# Patient Record
Sex: Female | Born: 1990 | Race: White | Hispanic: No | Marital: Married
Health system: Southern US, Community
[De-identification: ages and names within clinical notes are randomized; demographics above are authoritative.]

## PROBLEM LIST (undated history)

## (undated) DIAGNOSIS — D497 Neoplasm of unspecified behavior of endocrine glands and other parts of nervous system: Secondary | ICD-10-CM

## (undated) DIAGNOSIS — E039 Hypothyroidism, unspecified: Secondary | ICD-10-CM

## (undated) DIAGNOSIS — Z803 Family history of malignant neoplasm of breast: Secondary | ICD-10-CM

## (undated) DIAGNOSIS — L509 Urticaria, unspecified: Secondary | ICD-10-CM

## (undated) DIAGNOSIS — Z800 Family history of malignant neoplasm of digestive organs: Secondary | ICD-10-CM

## (undated) DIAGNOSIS — Z8481 Family history of carrier of genetic disease: Secondary | ICD-10-CM

## (undated) DIAGNOSIS — Z1589 Genetic susceptibility to other disease: Secondary | ICD-10-CM

## (undated) HISTORY — DX: Genetic susceptibility to other disease: Z15.89

## (undated) HISTORY — DX: Family history of carrier of genetic disease: Z84.81

## (undated) HISTORY — PX: CHOLECYSTECTOMY: SHX55

## (undated) HISTORY — DX: Urticaria, unspecified: L50.9

## (undated) HISTORY — DX: Family history of malignant neoplasm of breast: Z80.3

## (undated) HISTORY — DX: Family history of malignant neoplasm of digestive organs: Z80.0

---

## 2015-01-22 DIAGNOSIS — F988 Other specified behavioral and emotional disorders with onset usually occurring in childhood and adolescence: Secondary | ICD-10-CM | POA: Insufficient documentation

## 2015-12-02 DIAGNOSIS — R1011 Right upper quadrant pain: Secondary | ICD-10-CM | POA: Insufficient documentation

## 2015-12-02 DIAGNOSIS — K828 Other specified diseases of gallbladder: Secondary | ICD-10-CM | POA: Insufficient documentation

## 2016-05-29 ENCOUNTER — Encounter: Payer: Self-pay | Admitting: Pediatrics

## 2016-05-29 ENCOUNTER — Ambulatory Visit (INDEPENDENT_AMBULATORY_CARE_PROVIDER_SITE_OTHER): Payer: BC Managed Care – PPO | Admitting: Pediatrics

## 2016-05-29 VITALS — BP 100/72 | HR 76 | Temp 98.7°F | Resp 16 | Ht 62.0 in | Wt 146.4 lb

## 2016-05-29 DIAGNOSIS — L500 Allergic urticaria: Secondary | ICD-10-CM | POA: Insufficient documentation

## 2016-05-29 DIAGNOSIS — J3089 Other allergic rhinitis: Secondary | ICD-10-CM | POA: Diagnosis not present

## 2016-05-29 MED ORDER — RANITIDINE HCL 150 MG PO TABS: ORAL_TABLET | ORAL | 5 refills | Status: DC

## 2016-05-29 MED ORDER — FLUTICASONE PROPIONATE 50 MCG/ACT NA SUSP: NASAL | 5 refills | Status: DC

## 2016-05-29 NOTE — Patient Instructions (Addendum)
Environmental control of dust mite and mold Zyrtec 10 mg twice a day for itching or runny nose Ranitidine 150 mg twice a day to see if it helps with the hives Prednisone 20 mg twice a day for 3 days, 20 mg on the fourth day, 10 mg on the fifth day Do foods with salicylates make you itch? Call me if you are not doing better on this treatment plan Fluticasone 2 sprays per nostril once a day if needed for stuffy nose in  the springtime Opcon-A one drop 3 times a day if needed for itchy eyes

## 2016-05-29 NOTE — Progress Notes (Signed)
[place] [address] Dept: [phone]  New Patient Note  Patient ID: Lauren Arroyo, female    DOB: 11/14/1990  Age: 26 y.o. MRN: [id number] Date of Office Visit: 05/29/2016 Referring provider: Jonathon Bellows, PA-C [address] [place], [place] [address]    Chief Complaint: Urticaria (sx x 6 weeks.)  HPI Lauren Arroyo presents for evaluation of hives beginning  on 04/14/2016. There are no clearcut precipitants to her hives. Her hives come  and go She has used Benadryl for her hives. Levocetirizine did not help her very much. Prior to December 2017, she had never had hives. An injection of a steroid a few weeks ago did not help her hives dramatically .She has had nasal congestion for several years aggravated by exposure to spring pollens and cat. She has never had eczema or asthma. She has not had tick bites.  Review of Systems  Constitutional: Negative.   HENT:       Nasal congestion in the springtime and on exposure to cats  Eyes: Negative.   Respiratory: Negative.   Cardiovascular: Negative.   Gastrointestinal:       Cholecystectomy in August 2017  Genitourinary: Negative.   Musculoskeletal: Negative.   Skin:       Hives off and on since 04/14/2016  Neurological: Negative.   Endo/Heme/Allergies:       No diabetes or thyroid disease  Psychiatric/Behavioral: Negative.     Outpatient Encounter Prescriptions as of 05/29/2016  Medication Sig  . amphetamine-dextroamphetamine (ADDERALL) 10 MG tablet Take 10 mg by mouth.  . Levonorgestrel 13.5 MG IUD by Intrauterine route.  . Multiple Vitamin (MULTI-VITAMINS) TABS Take by mouth.  . ondansetron (ZOFRAN) 4 MG tablet   . fluticasone (FLONASE) 50 MCG/ACT nasal spray Two sprays each nostril once a day if needed for nasal congestion or drainage.  Marland Kitchen levocetirizine (XYZAL) 5 MG tablet Take 5 mg by mouth.  . ranitidine (ZANTAC) 150 MG tablet One tablet twice a day as directed.   No  facility-administered encounter medications on file as of 05/29/2016.      Drug Allergies:  Allergies  Allergen Reactions  . Ciprofloxacin Nausea And Vomiting  . Nitrofurantoin Nausea And Vomiting  . Sulfamethoxazole-Trimethoprim Nausea And Vomiting    Family History: Colene's family history includes Allergic rhinitis in her father; Heart block in her father..Childhood asthma in her father. There is no family history of angioedema, eczema, hives, food allergies or lupus.  Social and environmental. There is a dog in the home. She does not smoke. She has never smoked cigarettes. She is not exposed to cigarette smoking. She works as a Quarry manager in an [employer]  Physical Exam: BP 100/72 (BP Location: Left Arm, Patient Position: Sitting, Cuff Size: Normal)   Pulse 76   Temp 98.7 F (37.1 C) (Oral)   Resp 16   Ht 5\' 2"  (1.575 m)   Wt 146 lb 6.4 oz (66.4 kg)   SpO2 99%   BMI 26.78 kg/m    Physical Exam  Constitutional: She is oriented to person, place, and time. She appears well-developed and well-nourished.  HENT:  Eyes normal. Ears normal. Nose normal. Pharynx normal.  Neck: Neck supple. No thyromegaly present.  Cardiovascular:  S1 and S2 normal no murmurs  Pulmonary/Chest:  Clear to percussion and auscultation  Abdominal: Soft. There is no tenderness (no hepatosplenomegaly).  Lymphadenopathy:    She has no cervical adenopathy.  Neurological: She is alert and oriented to person, place, and  time.  Skin:  Clear but she had dermographia noted  Psychiatric: She has a normal mood and affect. Her behavior is normal. Judgment and thought content normal.  Vitals reviewed.   Diagnostics: Allergy skin tests were positive to grass pollens, tree pollens, molds, dust mite, cat, horse. Skin testing to foods was negative   Assessment  Assessment and Plan: 1. Allergic urticaria   2. Other allergic rhinitis     Meds ordered this encounter  Medications  . ranitidine (ZANTAC)  150 MG tablet    Sig: One tablet twice a day as directed.    Dispense:  60 tablet    Refill:  5  . fluticasone (FLONASE) 50 MCG/ACT nasal spray    Sig: Two sprays each nostril once a day if needed for nasal congestion or drainage.    Dispense:  16 g    Refill:  5    Patient Instructions  Environmental control of dust mite and mold Zyrtec 10 mg twice a day for itching or runny nose Ranitidine 150 mg twice a day to see if it helps with the hives Prednisone 20 mg twice a day for 3 days, 20 mg on the fourth day, 10 mg on the fifth day Do foods with salicylates make you itch? Call me if you are not doing better on this treatment plan Fluticasone 2 sprays per nostril once a day if needed for stuffy nose in  the springtime Opcon-A one drop 3 times a day if needed for itchy eyes   Return in about 4 weeks (around 06/26/2016).   Thank you for the opportunity to care for this patient.  Please do not hesitate to contact me with questions.  Penne Lash, M.D.  [facility] [address] [place], [place] [address] [phone]

## 2018-03-26 ENCOUNTER — Telehealth: Payer: Self-pay | Admitting: Genetics

## 2018-03-26 ENCOUNTER — Encounter: Payer: Self-pay | Admitting: Genetics

## 2018-03-26 NOTE — Telephone Encounter (Signed)
Received a genetic counseling referral from Dr. Jerelyn Charles for fhx of breast cancer. Pt has been cld and scheduled to see Link Snuffer on 12/2 at 3pm. Letter mailed to the pt.

## 2018-04-15 ENCOUNTER — Inpatient Hospital Stay: Payer: 59

## 2018-04-15 ENCOUNTER — Encounter: Payer: Self-pay | Admitting: Genetics

## 2018-04-15 ENCOUNTER — Inpatient Hospital Stay: Payer: 59 | Attending: Genetic Counselor | Admitting: Genetics

## 2018-04-15 DIAGNOSIS — Z8481 Family history of carrier of genetic disease: Secondary | ICD-10-CM | POA: Diagnosis not present

## 2018-04-15 DIAGNOSIS — Z800 Family history of malignant neoplasm of digestive organs: Secondary | ICD-10-CM | POA: Diagnosis not present

## 2018-04-15 DIAGNOSIS — Z803 Family history of malignant neoplasm of breast: Secondary | ICD-10-CM

## 2018-04-16 ENCOUNTER — Encounter: Payer: Self-pay | Admitting: Genetics

## 2018-04-16 DIAGNOSIS — Z800 Family history of malignant neoplasm of digestive organs: Secondary | ICD-10-CM | POA: Insufficient documentation

## 2018-04-16 DIAGNOSIS — Z803 Family history of malignant neoplasm of breast: Secondary | ICD-10-CM | POA: Insufficient documentation

## 2018-04-16 DIAGNOSIS — Z8481 Family history of carrier of genetic disease: Secondary | ICD-10-CM | POA: Insufficient documentation

## 2018-04-16 NOTE — Progress Notes (Signed)
REFERRING PROVIDER: Jerelyn Charles, MD [place], [place] [address]  PRIMARY PROVIDER:  Manfred Shirts, [place]  PRIMARY REASON FOR VISIT:  1. Family history of breast cancer   2. Family history of pancreatic cancer   3. Family history of genetic disease carrier     HISTORY OF PRESENT ILLNESS:   Lauren Arroyo, a 27 y.o. female, was seen for a [facility] cancer genetics consultation at the request of Dr. Carlis Abbott due to a family history of cancer and a 'breast cancer gene mutation' identified in her sister.  Lauren Arroyo thinks it was in one of the BRCA genes.  Lauren Arroyo presents to clinic today to discuss the possibility of a hereditary predisposition to cancer, genetic testing, and to further clarify her future cancer risks, as well as potential cancer risks for family members.   Lauren Arroyo is a 27 y.o. female with no personal history of cancer.    HORMONAL RISK FACTORS:  Menarche was at age 26.  First live birth at age N/A.  Ovaries intact: yes.  Hysterectomy: no.  Menopausal status: premenopausal.  HRT use: 0 years. Colonoscopy: no; not examined. Mammogram within the last year: no. Number of breast biopsies: 0.  Past Medical History:  Diagnosis Date  . Family history of breast cancer   . Family history of genetic disease carrier    sister breast cancer gene + (thinks BRCA)  . Family history of pancreatic cancer   . Urticaria     Past Surgical History:  Procedure Laterality Date  . CHOLECYSTECTOMY      Social History   Socioeconomic History  . Marital status: Single    Spouse name: Not on file  . Number of children: Not on file  . Years of education: Not on file  . Highest education level: Not on file  Occupational History  . Not on file  Social Needs  . Financial resource strain: Not on file  . Food insecurity:    Worry: Not on file    Inability: Not on file  . Transportation needs:    Medical: Not on file    Non-medical: Not on file  Tobacco  Use  . Smoking status: Never Smoker  . Smokeless tobacco: Never Used  Substance and Sexual Activity  . Alcohol use: No  . Drug use: No  . Sexual activity: Yes  Lifestyle  . Physical activity:    Days per week: Not on file    Minutes per session: Not on file  . Stress: Not on file  Relationships  . Social connections:    Talks on phone: Not on file    Gets together: Not on file    Attends religious service: Not on file    Active member of club or organization: Not on file    Attends meetings of clubs or organizations: Not on file    Relationship status: Not on file  Other Topics Concern  . Not on file  Social History Narrative  . Not on file     FAMILY HISTORY:  We obtained a detailed, 4-generation family history.  Significant diagnoses are listed below: Family History  Problem Relation Age of Onset  . Allergic rhinitis Father   . Heart block Father   . Other Sister        tested positive for a BRCA gene  . Angioedema Neg Hx   . Asthma Neg Hx   . Eczema Neg Hx   . Immunodeficiency Neg  Hx   . Urticaria Neg Hx     Lauren Arroyo has no children.  She has a full sister who is 4 and had genetic testing about a year ago that revealed a mutation in a 'breast cancer gene'- Lauren Arroyo thinks it was a BRCA gene.  Report is unavailable for review. Lauren Arroyo also has a maternal half-brother who is 37 and a maternal half-sister who is 82 with no hx of cancer.   Lauren Arroyo father: 14, no hx of cancer Paternal Aunts/Uncles: 1 paternal aunt in her 69'swith no hx of cancer.  Paternal cousins: 1 paternal cousin, no hx of cancer Paternal grandfather: unk Paternal grandmother:died in her 67's due to a brain aneurysm.   Lauren Arroyo mother: 60 no hx of cancer.  She had a hysterectomy.  Maternal Aunts/Uncles: 3 maternal uncles with no hx of cancer. Maternal cousins: no hx of cancer. Maternal grandfather: iin his 46's, no hx of cancer. He has a brother who had pancreatic cancer.   Maternal grandmother:died of breast cancer at 36.  She had a sister who had breast cancer in her 95's.   Patient's maternal ancestors are of N.European descent, and paternal ancestors are of N. European descent. There is no reported Ashkenazi Jewish ancestry. There is no known consanguinity.  GENETIC COUNSELING ASSESSMENT: Lauren Arroyo is a 27 y.o. female with a family history of a Hereditary Cancer Predisposition Syndrome. We, therefore, discussed and recommended the following at today's visit.   DISCUSSION: We reviewed the characteristics, features and inheritance patterns of hereditary cancer syndromes. We also discussed genetic testing, including the appropriate family members to test, the process of testing, insurance coverage and turn-around-time for results. We discussed the implications of a negative, positive and/or variant of uncertain significant result. We recommended Lauren Arroyo pursue genetic testing for the Breast and GYN gene panel (with the option to reflex to the mult-cancer panel).  Breast and Gyn cancers panel: ATM BARD1 BRCA1 BRCA2 BRIP1 CDH1 CHEK2 DICER1 EPCAM MLH1 MSH2 MSH6 NBN NF1 PALB2 PMS2 PTEN RAD50 RAD51C RAD51D SMARCA4 STK11 TP53   The Multi-Cancer Panel offered by [facility] includes sequencing and/or deletion duplication testing of the following 90 genes: AIP, ALK, APC, ATM, AXIN2, BAP1, BARD1, BLM, BMPR1A, BRCA1, BRCA2, BRIP1, BUB1B, CASR, CDC73, CDH1, CDK4, CDKN1B, CDKN1C, CDKN2A, CEBPA, CHEK2, CTNNA1, DICER1, DIS3L2, EGFR, ENG, EPCAM, FH, FLCN, GALNT12, GATA2, GPC3, GREM1, HOXB13, HRAS, KIT, MAX, MEN1, MET, MITF, MLH1, MLH3, MSH2, MSH3, MSH6, MUTYH, NBN, NF1, NF2, NTHL1, PALB2, PDGFRA, PHOX2B, PMS2, POLD1, POLE, POT1, PRKAR1A, PTCH1, PTEN, RAD50, RAD51C, RAD51D, RB1, RECQL4, RET, RNF43, RPS20, RUNX1, SDHA, SDHAF2, SDHB, SDHC, SDHD, SMAD4, SMARCA4, SMARCB1, SMARCE1, STK11, SUFU, TERC, TERT, TMEM127, TP53, TSC1, TSC2, VHL, WRN, WT1  We discussed that only 5-10% of  cancers are associated with a Hereditary cancer predisposition syndrome.  One of the most common hereditary cancer syndromes that increases breast cancer risk is called Hereditary Breast and Ovarian Cancer (HBOC) syndrome.  This syndrome is caused by mutations in the BRCA1 and BRCA2 genes.  This syndrome increases an individual's lifetime risk to develop breast, ovarian, pancreatic, and other types of cancer.  There are also many other cancer predisposition syndromes caused by mutations in several other genes.  Lauren Arroyo reports her sister's mutation was in a breast cancer gene, after our discussion she reported she thinks the gene was a BRCA gene, but she does not know which one.  Her sister's test report was unavailable for review, therefore, we recommended a panel.  We discussed that if she is found to have a mutation in one of these genes, it may impact future medical management recommendations such as increased cancer screenings and consideration of risk reducing surgeries.  A positive result could also have implications for the patient's family members.  A Negative result would mean she did not inherit the familial mutation and we did not identify a hereditary predisposition to cancer in her. However, genetic testing can never completely rule out any hereditary risk for cancer.  There could be mutations that are undetectable by current technology, or in genes not yet tested or identified to increase cancer risk.    We discussed the potential to find a Variant of Uncertain Significance or VUS.  These are variants that have not yet been identified as pathogenic or benign, and it is unknown if this variant is associated with increased cancer risk or if this is a normal finding.  Most VUS's are reclassified to benign or likely benign.   It should not be used to make medical management decisions. With time, we suspect the lab will determine the significance of any VUS's identified if any.   Based on Ms.  Watson's family history of a cancer predisposition mutation in her sister, she meets medical criteria for genetic testing. Despite that she meets criteria, she may still have an out of pocket cost. The laboratory can provide her with an estimate of her OOP cost.  she was given the contact information for the laboratory if she has further questions.   We discussed that some people do not want to undergo genetic testing due to fear of genetic discrimination.  A federal law called the Genetic Information Non-Discrimination Act (GINA) of 2008 helps protect individuals against genetic discrimination based on their genetic test results.  It impacts both health insurance and employment.  For health insurance, it protects against increased premiums, being kicked off insurance or being forced to take a test in order to be insured.  For employment it protects against hiring, firing and promoting decisions based on genetic test results.  Health status due to a cancer diagnosis is not protected under GINA.  This law does not protect life insurance, disability insurance, or other types of insurance.    PLAN: After considering the risks, benefits, and limitations, Lauren Arroyo  provided informed consent to pursue genetic testing and the blood sample was sent to [facility] for analysis of the Breast and Gyn cancers panel with plans to reflex to the Multi-cancer panel. Results should be available within approximately 2-3 weeks' time, at which point they will be disclosed by telephone to Lauren Arroyo, as will any additional recommendations warranted by these results. Lauren Arroyo will receive a summary of her genetic counseling visit and a copy of her results once available. This information will also be available in Epic. We encouraged Lauren Arroyo to remain in contact with cancer genetics annually so that we can continuously update the family history and inform her of any changes in cancer genetics and testing that may be of  benefit for her family. Lauren Arroyo questions were answered to her satisfaction today. Our contact information was provided should additional questions or concerns arise.  Based on Ms. Watson's family history, we recommended all of her relatives also have genetic testing.  I may be particularly helpful for her parents to get tested so we know which side of the family the mutation was inherited from and which relatives need testing.  Lauren Arroyo will let  us know if we can be of any assistance in coordinating genetic counseling and/or testing for this family member.   Lastly, we encouraged Lauren Arroyo to remain in contact with cancer genetics annually so that we can continuously update the family history and inform her of any changes in cancer genetics and testing that may be of benefit for this family.   Ms.  Philip Arroyo questions were answered to her satisfaction today. Our contact information was provided should additional questions or concerns arise. Thank you for the referral and allowing [place] to share in the care of your patient.   Tana Felts, MS, [facility] Certified Genetic Counselor [email or web address] phone: [phone]  The patient was seen for a total of 35 minutes in face-to-face genetic counseling.   This patient was discussed with Drs. Magrinat, Lindi Adie and/or Burr Medico who agrees with the above.

## 2018-05-01 ENCOUNTER — Telehealth: Payer: Self-pay | Admitting: Genetics

## 2018-05-01 NOTE — Telephone Encounter (Addendum)
Revealed positive genetic test results.  Lauren Arroyo elected for a large panel of genes to be tested.   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  No other mutations were found.   We discussed that TERT mutations are associated with Dyskeratosis congenita and autosomal dominant pulmonary fibrosis.  At the more severe spectrum, Dyskeratosis congentia causes skin pigment abnormalities, nail dystrophy, oral luekoplakia, bone marrow failure, pulmonary fibrosis, hematohyaloid cancers, squamous cell carcinoma.  [name] health history does not appear to fit this disease, although there is a wide spectrum of severity.  She is, however, at least a carrier for this disease and there could be reproductive risks.   We discussed mutations in TERT are also associated with adult onset pulmonary fibrosis.  Current studies indicate the risk is about 40%, and average age is 45.  The risk is lower and age of onset is later in females than males, and current studies indicate that over half of the TERT mutation carriers who developed pulmonary fibrosis had a current or previous history of smoking/cigarette smoke exposure. Several others reported other environmental exposures.  We explained that there is no cure for people who develop this disease, however, limiting environmental exposures are a one way she can reduce her risk or delay the onset of this disease.   We discussed there may be some risk for other conditions such as liver cirrhosis and  aplastic anemai.  No guidelines exist for management/screening for TERT carriers at this time, but she can consider/discuss with her doctors monitoring for these.   We reviewed her family history.  Lauren Arroyo does not report any family members with pulmonary fibrosis.  She does report that her brother started getting gray hair very young (20's/30's).  [name] family members are also at risk to have this TERT  mutation.  If they would like to be tested, we would be happy to see them or help them find a genetics provider near them.    We discussed a follow-up plan with Lauren Arroyo.  We offered her a referral to the pulmonology fibrosis clinic with Dr. Chase Caller, and also encouraged her to come in for a follow-up discussion in person with genetics to discuss her positive test.  At this time she declined a follow-up appointment with genetics.  She states after the holidays she may reach out to discuss further at that time.  She was interested in a referral to Dr. Chase Caller to learn more about Pulmonary fibrosis and discuss her risk. We will forward her results to his office and they can reach out to set this appointment up.    We discussed there are reproductive implications for this if she is planning on having a child in the future.  We did not go into detail at this time as she was understanding this result and the implications for herself, but knows there may implications and to discuss these with genetics or her OBGYN provider when ready.

## 2018-05-14 ENCOUNTER — Ambulatory Visit: Payer: Self-pay | Admitting: Genetics

## 2018-05-14 ENCOUNTER — Encounter: Payer: Self-pay | Admitting: Genetics

## 2018-05-14 DIAGNOSIS — Z803 Family history of malignant neoplasm of breast: Secondary | ICD-10-CM

## 2018-05-14 DIAGNOSIS — Z1589 Genetic susceptibility to other disease: Secondary | ICD-10-CM

## 2018-05-14 DIAGNOSIS — Z800 Family history of malignant neoplasm of digestive organs: Secondary | ICD-10-CM

## 2018-05-14 DIAGNOSIS — Z1379 Encounter for other screening for genetic and chromosomal anomalies: Secondary | ICD-10-CM | POA: Insufficient documentation

## 2018-05-14 DIAGNOSIS — Z8481 Family history of carrier of genetic disease: Secondary | ICD-10-CM

## 2018-05-14 HISTORY — DX: Genetic susceptibility to other disease: Z15.89

## 2018-05-14 NOTE — Progress Notes (Signed)
HPI:  [name] was previously seen in the [place] clinic on 04/15/2018 due to a family history of cancer and a genetic mutation (thinks it was in BRCA) and concerns regarding a hereditary predisposition to cancer. Please refer to our prior cancer genetics clinic note for more information regarding Lauren Arroyo's medical, social and family histories, and our assessment and recommendations, at the time. [name] recent genetic test results were disclosed to her, as well as recommendations warranted by these results. These results and recommendations are discussed in more detail below.  At this time [name] decided to proceed with genetic testing.  We discussed options including (targeted organ-specific panel and pan-cancer panels).  She elected to have a large-pan cancer panel.   CANCER HISTORY:   No history exists.     FAMILY HISTORY:  We obtained a detailed, 4-generation family history.  Significant diagnoses are listed below: Family History  Problem Relation Age of Onset  . Allergic rhinitis Father   . Heart block Father   . Other Sister        tested positive for a BRCA gene  . Other Brother        early gray hair  . Breast cancer Maternal Grandmother 9  . Breast cancer Other 75  . Pancreatic cancer Other   . Angioedema Neg Hx   . Asthma Neg Hx   . Eczema Neg Hx   . Immunodeficiency Neg Hx   . Urticaria Neg Hx    [name] has no children.  She has a full sister who is 77 and had genetic testing about a year ago that revealed a mutation in a 'breast cancer gene'- [name] thinks it was a BRCA gene.  Report is unavailable for review. [name] also has a maternal half-brother who is 38 and a maternal half-sister who is 53 with no hx of cancer.   [name] father: 75, no hx of cancer Paternal Aunts/Uncles: 1 paternal aunt in her 58'swith no hx of cancer.  Paternal cousins: 1 paternal cousin, no hx of cancer Paternal grandfather: unk Paternal  grandmother:died in her 25's due to a brain aneurysm.   [name] mother: 39 no hx of cancer.  She had a hysterectomy.  Maternal Aunts/Uncles: 3 maternal uncles with no hx of cancer. Maternal cousins: no hx of cancer. Maternal grandfather: iin his 29's, no hx of cancer. He has a brother who had pancreatic cancer.  Maternal grandmother:died of breast cancer at 59.  She had a sister who had breast cancer in her 45's.   Patient's maternal ancestors are of N.European descent, and paternal ancestors are of N. European descent. There is no reported Ashkenazi Jewish ancestry. There is no known consanguinity.  GENETIC TEST RESULTS: Genetic testing was  performed through [facility]'s Multi-Cancer Panel reported and reported out on 04/25/2018.  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  Mutations in this gene are consistent with predisposition toTERT-related conditions (including pulmonary fibrosis).  It can also be associated with autosomal recessive and autosomal dominant dyskeratosis congenita.   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  Her test was negative for all other hereditary caner mutations (including full panel of breast cancer risk genes).   The Multi-Cancer Panel offered by [facility] includes sequencing and/or deletion duplication testing of the following 90 genes: AIP, ALK, APC, ATM, AXIN2, BAP1, BARD1, BLM, BMPR1A, BRCA1, BRCA2,  BRIP1, BUB1B, CASR, CDC73, CDH1, CDK4, CDKN1B, CDKN1C, CDKN2A, CEBPA, CHEK2, CTNNA1, DICER1, DIS3L2, EGFR, ENG, EPCAM, FH, FLCN, GALNT12, GATA2, GPC3, GREM1, HOXB13, HRAS, KIT, MAX, MEN1, MET, MITF, MLH1, MLH3, MSH2, MSH3, MSH6, MUTYH, NBN, NF1, NF2, NTHL1, PALB2, PDGFRA, PHOX2B, PMS2, POLD1, POLE, POT1, PRKAR1A, PTCH1, PTEN, RAD50, RAD51C, RAD51D, RB1, RECQL4, RET, RNF43, RPS20, RUNX1, SDHA, SDHAF2, SDHB, SDHC, SDHD, SMAD4, SMARCA4,  SMARCB1, SMARCE1, STK11, SUFU, TERC, TERT, TMEM127, TP53, TSC1, TSC2, VHL, WRN, WT1  The test report will be scanned into EPIC and will be located under the Molecular Pathology section of the Results Review tab. A portion of the result report is included below for reference.     We discussed with [name] that because current genetic testing is not perfect, it is possible there may be a gene mutation in one of these genes that current testing cannot detect, but that chance is small.  We also discussed, that there could be another gene that has not yet been discovered, or that we have not yet tested, that is responsible for the cancer diagnoses in the family. It is also possible there is a hereditary cause for the cancer in the family that [name] did not inherit and therefore was not identified in her testing.  Therefore, it is important to remain in touch with cancer genetics in the future so that we can continue to offer [name] the most up to date genetic testing.   Regarding the VUS in RNF43: At this time, it is unknown if this variant is associated with increased cancer risk or if this is a normal finding, but most variants such as this get reclassified to being inconsequential. It should not be used to make medical management decisions. With time, we suspect the lab will determine the significance of this variant, if any. If we do learn more about it, we will try to contact [name] to discuss it further. However, it is important to stay in touch with [place] periodically and keep the address and phone number up to date.  TERT-Related Conditions: We discussed that TERT mutations are associated with a variety of different conditions. Mutations in this gene cause telomere shortening.   In the more severe spectrum, mutations in this gene cause autosomal dominant and autosomal recessive Dyskeratosis Congentia.  This disease causes skin pigment abnormalities, nail dystrophy, oral luekoplakia, bone  marrow failure, pulmonary fibrosis, hematohyaloid cancers, and squamous cell carcinoma.  [name] health history does not appear to fit this disease, although there is a wide spectrum of severity.  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  If 2 carriers of the same autosomal recessive disease have children together, their child be affected with the disease.   We discussed mutations in TERT are also associated with autosomal dominant adult onset TERT-related conditions; Primarily, Adult onset Idiopathic Pulmonary Fibrosis. Early evidence suggests the risk for TERT mutation carriers to develop Pulmonary fibrosis is on average about 40%, and the average of onset is 51.   However, risk seems to be higher for males than females and the average of onset is lower for males than females.  Also, over half of the individuals in this study who developed Pulmonary fibrosis were current smokers or had a history of smoking suggesting an association with sensitivity to environmental exposures. There is also an increased risk for other health conditions such as low blood counts (  aplastic anemia), early gray hair, or liver disease. There may be other risks that at this time are not well described.    (PMID [id number])  We explained that pulmonary fibrosis is a lethal disease and  there is no cure for people who develop this disease, however, limiting environmental exposures are a one way she can reduce her risk or delay the onset of this disease.   FAMILIAL PULMONARY FIBROSIS MANAGEMENT RECOMMENDATIONS: At present, there are no evidence-based recommendations for at risk individuals in FPF families. Thus, the following recommendations are based on expert opinion and should be modified for each individual by his/her personal physician.     Pulmonary screening: -Asymptomatic at risk individuals in FPF families can consider  baseline pulmonary function tests (breathing tests) as well as a visit to a pulmonologist beginning around age 77 -Individuals who develop breathing difficulty symptoms such as shortness of breath or persistent cough at any age should seek evaluation to determine the cause -The use of chest xray or CT scan screening should be individualized -Intervals for screening can be determined based on findings on baseline evaluation and/or symptoms   Disease prevention: -Avoid smoking and all smoke exposure -Avoid exposure to other known lung toxins such as asbestos and metal dusts -reducing exposure to benzene (ingredient in gasoline) may also limit risk for aplastic anemia  We did perform a Tyrer Cusik risk estimate for [name], however given her age, this will likely change significantly over time and should be repeated when she turns 72 and throughout her life for the most accurate assessment.  This model estimates her lifetime risk to develop breast cancer is 9.9% (assuming her sister was positive for a BRCA2 mutation-[name] did not know for sure which gene she was + for).  ACS recommends a woman for high risk screening if a woman's risk exceeds 20%.       Family Discussion:   [name] does not report any family members with pulmonary fibrosis.  She does report that her brother started getting gray hair very young (20's/30's).    [name] family members are also at risk to have this TERT mutation (as would any future children she may have).  If they would like to be tested, we would be happy to see them or help them find a genetics provider near them.   In some families anticipation (progressive earlier ages onset of disease in successive generations) has been observed.  Furthermore, all of her relatives should be tested for the familial BRCA/breast cancer risk mutation that was identified in Lauren Arroyo's sister.   [name] is aware of reproductive risk to pass on her TERT mutation  as well as potentially being a carrier for Dyskeratosis Congenita.  We recommended she see a genetics provider in the future to discuss these risks further if she is starting to think about family planning.   PLAN:  1. [name] declined a follow-up appointment with [place] in genetic clinic at this time. She says she may reach out out to [place] in the new year to set up follow-up if she still has questions or wants to discuss futher.   2.  We offered [name] a referral to Dr. Chase Caller who runs the pulmonary fibrosis clinic. She was interested in meeting with him to learn more about pulmonary fibrosis.  We will let his office know so they can set this up for her.   Our contact number was provided. [name] questions were answered to  her satisfaction, and she knows she is welcome to call us at anytime with additional questions or concerns.   Ferol Luz, MS, [facility] Certified Genetic Counselor [email or web address]

## 2018-06-11 ENCOUNTER — Telehealth: Payer: Self-pay | Admitting: Genetics

## 2018-06-11 NOTE — Telephone Encounter (Signed)
Touched base with patient regarding referral to pulmonologist- pulmonary fibrosis clinic.   The [place] office has scheduled her for Thursday Jul 04, 2018 at 9:30 AM with check-in time at 9:15AM.   [address] Dr. Chase Caller.   I gave her their phone number she can call if she needs to change that appointment time: [phone] .   She agrees with the plan and will follow-up with Dr. Golden Pop office if any changes.

## 2018-07-04 ENCOUNTER — Institutional Professional Consult (permissible substitution): Payer: 59 | Admitting: Internal Medicine

## 2019-04-07 DIAGNOSIS — Z01419 Encounter for gynecological examination (general) (routine) without abnormal findings: Secondary | ICD-10-CM | POA: Diagnosis not present

## 2019-05-16 NOTE — L&D Delivery Note (Addendum)
Delivery Note Lauren Arroyo is a G1P0 at [redacted]w[redacted]d who had a spontaneous delivery at 1054 a viable female was delivered via  LOA. Apgars 8 and 9, weight 7lb 2.1oz  Admitted for induction of labor for gestational hypertension. Induced with cytotec and pitocin. Progressed normally . Pushed for 2.5 hours. Baby was delivered without difficulty. No nuchal cord.  Delayed cord clamping for 60 seconds.  Delivery of placenta was spontaneous.  Placenta was found to be intact 3 -vessel cord was noted. The fundus was found to be firm.  2nd degree perineal laceration was repaired in 2 layers, reapproximating the deep layer with 2-0 vicryl and the superficial layer with 3-0 vicryl. The superficial laceration extended to just superior of the anus. A digital rectal exam demonstrated good rectal tone and no sutures. Estimated blood loss 250cc.  Instrument and gauze counts were correct at the end of the procedure.  Anesthesia: Epidural  Episiotomy:  None  Lacerations:  2nd degree perineal Suture Repair: 2.0 and 3.0 vicryl Est. Blood Loss (mL):  223mL  Mom to postpartum.  Baby to Couplet care / Skin to Skin. Discontinue antibiotics. Standing colace ordered.  Rowland Lathe 03/13/2020, 11:27 AM

## 2019-06-23 DIAGNOSIS — Z03818 Encounter for observation for suspected exposure to other biological agents ruled out: Secondary | ICD-10-CM | POA: Diagnosis not present

## 2019-06-23 DIAGNOSIS — Z20828 Contact with and (suspected) exposure to other viral communicable diseases: Secondary | ICD-10-CM | POA: Diagnosis not present

## 2019-06-26 DIAGNOSIS — H5213 Myopia, bilateral: Secondary | ICD-10-CM | POA: Diagnosis not present

## 2019-08-06 DIAGNOSIS — Z3201 Encounter for pregnancy test, result positive: Secondary | ICD-10-CM | POA: Diagnosis not present

## 2019-08-06 DIAGNOSIS — N925 Other specified irregular menstruation: Secondary | ICD-10-CM | POA: Diagnosis not present

## 2019-08-06 DIAGNOSIS — R69 Illness, unspecified: Secondary | ICD-10-CM | POA: Diagnosis not present

## 2019-08-06 DIAGNOSIS — Z3401 Encounter for supervision of normal first pregnancy, first trimester: Secondary | ICD-10-CM | POA: Diagnosis not present

## 2019-09-05 DIAGNOSIS — Z3480 Encounter for supervision of other normal pregnancy, unspecified trimester: Secondary | ICD-10-CM | POA: Diagnosis not present

## 2019-09-05 DIAGNOSIS — O3680X9 Pregnancy with inconclusive fetal viability, other fetus: Secondary | ICD-10-CM | POA: Diagnosis not present

## 2019-09-05 DIAGNOSIS — Z3481 Encounter for supervision of other normal pregnancy, first trimester: Secondary | ICD-10-CM | POA: Diagnosis not present

## 2019-09-05 LAB — OB RESULTS CONSOLE HEPATITIS B SURFACE ANTIGEN: Hepatitis B Surface Ag: NEGATIVE

## 2019-09-05 LAB — OB RESULTS CONSOLE GC/CHLAMYDIA
Chlamydia: NEGATIVE
Gonorrhea: NEGATIVE

## 2019-09-05 LAB — OB RESULTS CONSOLE HIV ANTIBODY (ROUTINE TESTING): HIV: NONREACTIVE

## 2019-09-05 LAB — OB RESULTS CONSOLE ABO/RH: RH Type: POSITIVE

## 2019-09-05 LAB — OB RESULTS CONSOLE RUBELLA ANTIBODY, IGM: Rubella: IMMUNE

## 2019-09-05 LAB — OB RESULTS CONSOLE ANTIBODY SCREEN: Antibody Screen: NEGATIVE

## 2019-09-05 LAB — OB RESULTS CONSOLE RPR: RPR: NONREACTIVE

## 2019-10-02 DIAGNOSIS — Z369 Encounter for antenatal screening, unspecified: Secondary | ICD-10-CM | POA: Diagnosis not present

## 2019-10-02 DIAGNOSIS — Z3482 Encounter for supervision of other normal pregnancy, second trimester: Secondary | ICD-10-CM | POA: Diagnosis not present

## 2019-10-29 DIAGNOSIS — Z363 Encounter for antenatal screening for malformations: Secondary | ICD-10-CM | POA: Diagnosis not present

## 2019-10-31 DIAGNOSIS — Z3482 Encounter for supervision of other normal pregnancy, second trimester: Secondary | ICD-10-CM | POA: Diagnosis not present

## 2019-10-31 DIAGNOSIS — Z3483 Encounter for supervision of other normal pregnancy, third trimester: Secondary | ICD-10-CM | POA: Diagnosis not present

## 2019-11-28 DIAGNOSIS — Z369 Encounter for antenatal screening, unspecified: Secondary | ICD-10-CM | POA: Diagnosis not present

## 2019-12-10 DIAGNOSIS — M9903 Segmental and somatic dysfunction of lumbar region: Secondary | ICD-10-CM | POA: Diagnosis not present

## 2019-12-10 DIAGNOSIS — M545 Low back pain: Secondary | ICD-10-CM | POA: Diagnosis not present

## 2019-12-17 DIAGNOSIS — M545 Low back pain: Secondary | ICD-10-CM | POA: Diagnosis not present

## 2019-12-17 DIAGNOSIS — M9903 Segmental and somatic dysfunction of lumbar region: Secondary | ICD-10-CM | POA: Diagnosis not present

## 2019-12-24 DIAGNOSIS — Z3480 Encounter for supervision of other normal pregnancy, unspecified trimester: Secondary | ICD-10-CM | POA: Diagnosis not present

## 2019-12-24 DIAGNOSIS — Z23 Encounter for immunization: Secondary | ICD-10-CM | POA: Diagnosis not present

## 2020-01-02 DIAGNOSIS — O99810 Abnormal glucose complicating pregnancy: Secondary | ICD-10-CM | POA: Diagnosis not present

## 2020-01-16 DIAGNOSIS — Z369 Encounter for antenatal screening, unspecified: Secondary | ICD-10-CM | POA: Diagnosis not present

## 2020-01-30 DIAGNOSIS — Z3480 Encounter for supervision of other normal pregnancy, unspecified trimester: Secondary | ICD-10-CM | POA: Diagnosis not present

## 2020-02-12 DIAGNOSIS — Z23 Encounter for immunization: Secondary | ICD-10-CM | POA: Diagnosis not present

## 2020-02-26 DIAGNOSIS — Z3480 Encounter for supervision of other normal pregnancy, unspecified trimester: Secondary | ICD-10-CM | POA: Diagnosis not present

## 2020-02-26 LAB — OB RESULTS CONSOLE GBS: GBS: NEGATIVE

## 2020-03-04 DIAGNOSIS — Z369 Encounter for antenatal screening, unspecified: Secondary | ICD-10-CM | POA: Diagnosis not present

## 2020-03-11 DIAGNOSIS — Z369 Encounter for antenatal screening, unspecified: Secondary | ICD-10-CM | POA: Diagnosis not present

## 2020-03-12 ENCOUNTER — Inpatient Hospital Stay (HOSPITAL_COMMUNITY)
Admission: AD | Admit: 2020-03-12 | Discharge: 2020-03-15 | DRG: 805 | Disposition: A | Discharge status: Home / Self Care | Payer: 59 | Attending: Obstetrics and Gynecology | Admitting: Obstetrics and Gynecology

## 2020-03-12 ENCOUNTER — Other Ambulatory Visit: Payer: Self-pay

## 2020-03-12 ENCOUNTER — Inpatient Hospital Stay (HOSPITAL_COMMUNITY): Admission: AD | Admit: 2020-03-12 | Payer: 59 | Source: Home / Self Care | Admitting: Obstetrics and Gynecology

## 2020-03-12 ENCOUNTER — Inpatient Hospital Stay (HOSPITAL_COMMUNITY): Payer: 59 | Admitting: Anesthesiology

## 2020-03-12 ENCOUNTER — Encounter (HOSPITAL_COMMUNITY): Payer: Self-pay | Admitting: *Deleted

## 2020-03-12 DIAGNOSIS — O4292 Full-term premature rupture of membranes, unspecified as to length of time between rupture and onset of labor: Secondary | ICD-10-CM | POA: Diagnosis present

## 2020-03-12 DIAGNOSIS — O134 Gestational [pregnancy-induced] hypertension without significant proteinuria, complicating childbirth: Secondary | ICD-10-CM | POA: Diagnosis not present

## 2020-03-12 DIAGNOSIS — Z3A38 38 weeks gestation of pregnancy: Secondary | ICD-10-CM | POA: Diagnosis not present

## 2020-03-12 DIAGNOSIS — O411230 Chorioamnionitis, third trimester, not applicable or unspecified: Secondary | ICD-10-CM | POA: Diagnosis present

## 2020-03-12 DIAGNOSIS — Z20822 Contact with and (suspected) exposure to covid-19: Secondary | ICD-10-CM | POA: Diagnosis not present

## 2020-03-12 DIAGNOSIS — O139 Gestational [pregnancy-induced] hypertension without significant proteinuria, unspecified trimester: Secondary | ICD-10-CM | POA: Diagnosis present

## 2020-03-12 DIAGNOSIS — Z369 Encounter for antenatal screening, unspecified: Secondary | ICD-10-CM | POA: Diagnosis not present

## 2020-03-12 LAB — COMPREHENSIVE METABOLIC PANEL
ALT: 20 U/L (ref 0–44)
AST: 26 U/L (ref 15–41)
Albumin: 2.9 g/dL — ABNORMAL LOW (ref 3.5–5.0)
Alkaline Phosphatase: 183 U/L — ABNORMAL HIGH (ref 38–126)
Anion gap: 9 (ref 5–15)
BUN: 7 mg/dL (ref 6–20)
CO2: 20 mmol/L — ABNORMAL LOW (ref 22–32)
Calcium: 9 mg/dL (ref 8.9–10.3)
Chloride: 108 mmol/L (ref 98–111)
Creatinine, Ser: 0.59 mg/dL (ref 0.44–1.00)
GFR, Estimated: >60 mL/min (ref >60)
Glucose, Bld: 88 mg/dL (ref 70–99)
Potassium: 4.1 mmol/L (ref 3.5–5.1)
Sodium: 137 mmol/L (ref 135–145)
Total Bilirubin: 0.6 mg/dL (ref 0.3–1.2)
Total Protein: 5.9 g/dL — ABNORMAL LOW (ref 6.5–8.1)

## 2020-03-12 LAB — CBC
HCT: 39.7 % (ref 36.0–46.0)
HCT: 36.1 % (ref 36.0–46.0)
Hemoglobin: 12.9 g/dL (ref 12.0–15.0)
Hemoglobin: 11.8 g/dL — ABNORMAL LOW (ref 12.0–15.0)
MCH: 30.1 pg (ref 26.0–34.0)
MCH: 30 pg (ref 26.0–34.0)
MCHC: 32.5 g/dL (ref 30.0–36.0)
MCHC: 32.7 g/dL (ref 30.0–36.0)
MCV: 92.5 fL (ref 80.0–100.0)
MCV: 91.9 fL (ref 80.0–100.0)
Platelets: 331 10*3/uL (ref 150–400)
Platelets: 298 10*3/uL (ref 150–400)
RBC: 4.29 MIL/uL (ref 3.87–5.11)
RBC: 3.93 MIL/uL (ref 3.87–5.11)
RDW: 13.2 % (ref 11.5–15.5)
RDW: 13.1 % (ref 11.5–15.5)
WBC: 11 10*3/uL — ABNORMAL HIGH (ref 4.0–10.5)
WBC: 12.8 10*3/uL — ABNORMAL HIGH (ref 4.0–10.5)
nRBC: 0 % (ref 0.0–0.2)
nRBC: 0 % (ref 0.0–0.2)

## 2020-03-12 LAB — TYPE AND SCREEN
ABO/RH(D): O POS
Antibody Screen: NEGATIVE

## 2020-03-12 LAB — PROTEIN / CREATININE RATIO, URINE
Creatinine, Urine: 195.34 mg/dL
Protein Creatinine Ratio: 0.08 mg/mg{Cre} (ref 0.00–0.15)
Total Protein, Urine: 16 mg/dL

## 2020-03-12 LAB — LACTATE DEHYDROGENASE: LDH: 162 U/L (ref 98–192)

## 2020-03-12 LAB — RESPIRATORY PANEL BY RT PCR (FLU A&B, COVID)
Influenza A by PCR: NEGATIVE
Influenza B by PCR: NEGATIVE
SARS Coronavirus 2 by RT PCR: NEGATIVE

## 2020-03-12 MED ORDER — OXYCODONE-ACETAMINOPHEN 5-325 MG PO TABS: 2.0000 | ORAL_TABLET | ORAL | Status: DC | PRN

## 2020-03-12 MED ORDER — LIDOCAINE HCL (PF) 1 % IJ SOLN: 30.0000 mL | INTRAMUSCULAR | Status: DC | PRN

## 2020-03-12 MED ORDER — PHENYLEPHRINE 40 MCG/ML (10ML) SYRINGE FOR IV PUSH (FOR BLOOD PRESSURE SUPPORT)
80.0000 ug | PREFILLED_SYRINGE | INTRAVENOUS | Status: DC | PRN
  Filled 2020-03-12: qty 10

## 2020-03-12 MED ORDER — OXYCODONE-ACETAMINOPHEN 5-325 MG PO TABS: 1.0000 | ORAL_TABLET | ORAL | Status: DC | PRN

## 2020-03-12 MED ORDER — SOD CITRATE-CITRIC ACID 500-334 MG/5ML PO SOLN: 30.0000 mL | ORAL | Status: DC | PRN

## 2020-03-12 MED ORDER — LACTATED RINGERS IV SOLN: INTRAVENOUS | Status: DC

## 2020-03-12 MED ORDER — FENTANYL CITRATE (PF) 100 MCG/2ML IJ SOLN
50.0000 ug | INTRAMUSCULAR | Status: DC | PRN
  Administered 2020-03-12 (×2): 100 ug via INTRAVENOUS
  Filled 2020-03-12 (×2): qty 2

## 2020-03-12 MED ORDER — TERBUTALINE SULFATE 1 MG/ML IJ SOLN: 0.2500 mg | Freq: Once | INTRAMUSCULAR | Status: DC | PRN

## 2020-03-12 MED ORDER — EPHEDRINE 5 MG/ML INJ
10.0000 mg | INTRAVENOUS | Status: DC | PRN
  Filled 2020-03-12: qty 2

## 2020-03-12 MED ORDER — OXYTOCIN-SODIUM CHLORIDE 30-0.9 UT/500ML-% IV SOLN
2.5000 [IU]/h | INTRAVENOUS | Status: DC
  Filled 2020-03-12: qty 500

## 2020-03-12 MED ORDER — ONDANSETRON HCL 4 MG/2ML IJ SOLN: 4.0000 mg | Freq: Four times a day (QID) | INTRAMUSCULAR | Status: DC | PRN

## 2020-03-12 MED ORDER — SODIUM CHLORIDE (PF) 0.9 % IJ SOLN
INTRAMUSCULAR | Status: DC | PRN
  Administered 2020-03-12: 12 mL/h via EPIDURAL

## 2020-03-12 MED ORDER — FENTANYL-BUPIVACAINE-NACL 0.5-0.125-0.9 MG/250ML-% EP SOLN: 12.0000 mL/h | EPIDURAL | Status: DC | PRN

## 2020-03-12 MED ORDER — LIDOCAINE HCL (PF) 1 % IJ SOLN
INTRAMUSCULAR | Status: DC | PRN
  Administered 2020-03-12: 6 mL via EPIDURAL

## 2020-03-12 MED ORDER — OXYTOCIN BOLUS FROM INFUSION
333.0000 mL | Freq: Once | INTRAVENOUS | Status: AC
  Administered 2020-03-13: 333 mL via INTRAVENOUS

## 2020-03-12 MED ORDER — DIPHENHYDRAMINE HCL 50 MG/ML IJ SOLN: 12.5000 mg | INTRAMUSCULAR | Status: DC | PRN

## 2020-03-12 MED ORDER — LACTATED RINGERS IV SOLN: 500.0000 mL | INTRAVENOUS | Status: DC | PRN

## 2020-03-12 MED ORDER — FENTANYL-BUPIVACAINE-NACL 0.5-0.125-0.9 MG/250ML-% EP SOLN
12.0000 mL/h | EPIDURAL | Status: DC | PRN
  Administered 2020-03-13: 12 mL/h via EPIDURAL
  Filled 2020-03-12 (×2): qty 250

## 2020-03-12 MED ORDER — LACTATED RINGERS IV SOLN: 500.0000 mL | Freq: Once | INTRAVENOUS | Status: DC

## 2020-03-12 MED ORDER — OXYTOCIN-SODIUM CHLORIDE 30-0.9 UT/500ML-% IV SOLN
1.0000 m[IU]/min | INTRAVENOUS | Status: DC
  Administered 2020-03-13: 2 m[IU]/min via INTRAVENOUS

## 2020-03-12 MED ORDER — ACETAMINOPHEN 325 MG PO TABS
650.0000 mg | ORAL_TABLET | ORAL | Status: DC | PRN
  Administered 2020-03-12 – 2020-03-13 (×2): 650 mg via ORAL
  Filled 2020-03-12 (×2): qty 2

## 2020-03-12 MED ORDER — MISOPROSTOL 25 MCG QUARTER TABLET
25.0000 ug | ORAL_TABLET | ORAL | Status: DC | PRN
  Administered 2020-03-12 (×2): 25 ug via VAGINAL
  Filled 2020-03-12 (×2): qty 1

## 2020-03-12 NOTE — Progress Notes (Signed)
Into room with RN for prolonged decel to 90s from 2310 to 2319. Position changed and IV fluids bolused. SVE 7/90/0. FHR recovered to 150bpm baseline.   At this time, FHR is 175bpm with moderate variability and no decels. Toco: q 2 mins. She is afebrile. Continue IV fluids. Expectant management.  Anticipate NSVD.   Irene Pap, MD 03/12/20 11:39 PM

## 2020-03-12 NOTE — H&P (Signed)
Lauren Arroyo is a 29 y.o. female G37P0 [redacted]w[redacted]d presenting for IOL for gestational HTN, newly diagnosed over the course of the last 2 days.   Since admission, she has received 2 doses of cytotec and SROM for clear fluid at 1517  Pregnancy c/b: 1. New diagnosis gestational hypertension: mild BP elevations, admission preeclampsia labs normal   OB History    Gravida  1   Para      Term      Preterm      AB      Living        SAB      TAB      Ectopic      Multiple      Live Births             History reviewed. No pertinent past medical history. History reviewed. No pertinent surgical history. Family History: family history is not on file. Social History:  reports that she does not drink alcohol and does not use drugs. No history on file for tobacco use.     Maternal Diabetes: No Genetic Screening: Normal Maternal Ultrasounds/Referrals: Normal Fetal Ultrasounds or other Referrals:  None Maternal Substance Abuse:  No Significant Maternal Medications:  None Significant Maternal Lab Results:  Group B Strep negative Other Comments:  None  Review of Systems Per HPI Exam Physical Exam  Dilation: 1 Effacement (%): Thick Station: -3 Exam by:: Lenise Herald RN Blood pressure 128/76, pulse 86, temperature 98 F (36.7 C), temperature source Oral, resp. rate 18, height 5\' 2"  (1.575 m), weight 88.9 kg, last menstrual period 06/16/2019. NAD, resting comfortably, discomfort with contractions Gravid abdomen Fetal testing: FHR 125bpm, moderate variability, + accels, no decels Toco: irregular Prenatal labs: ABO, Rh:  --/--/O POS (10/29 1119) Antibody: NEG (10/29 1119) Rubella: Immune (04/23 0000) RPR: Nonreactive (04/23 0000)  HBsAg: Negative (04/23 0000)  HIV: Non-reactive (04/23 0000)  GBS: Negative/-- (10/14 0000)   Assessment/Plan: 29Y G1P0 @ 38.4, IOL gestational HTN 1. IOL: s/p cytotec x 2, plan to start pitocin 4 hours after last cytotec dose 2. Gest  HTN: monitor BP, normal preeclampsia labs 3. Epidural upon patient request   Rowland Lathe 03/12/2020, 5:33 PM

## 2020-03-12 NOTE — Anesthesia Procedure Notes (Signed)
Epidural Patient location during procedure: OB Start time: 03/12/2020 7:18 PM End time: 03/12/2020 7:22 PM  Staffing Anesthesiologist: Janeece Riggers, MD  Preanesthetic Checklist Completed: patient identified, IV checked, site marked, risks and benefits discussed, surgical consent, monitors and equipment checked, pre-op evaluation and timeout performed  Epidural Patient position: sitting Prep: DuraPrep and site prepped and draped Patient monitoring: continuous pulse ox and blood pressure Approach: midline Location: L3-L4 Injection technique: LOR air  Needle:  Needle type: Tuohy  Needle gauge: 17 G Needle length: 9 cm and 9 Needle insertion depth: 6 cm Catheter type: closed end flexible Catheter size: 19 Gauge Catheter at skin depth: 11 cm Test dose: negative  Assessment Events: blood not aspirated, injection not painful, no injection resistance, no paresthesia and negative IV test

## 2020-03-12 NOTE — Anesthesia Preprocedure Evaluation (Signed)
Anesthesia Evaluation  Patient identified by MRN, date of birth, ID band Patient awake    Reviewed: Allergy & Precautions, H&P , NPO status , Patient's Chart, lab work & pertinent test results, reviewed documented beta blocker date and time   Airway Mallampati: II  TM Distance: >3 FB Neck ROM: full    Dental no notable dental hx. (+) Teeth Intact, Dental Advisory Given   Pulmonary neg pulmonary ROS,    Pulmonary exam normal breath sounds clear to auscultation       Cardiovascular hypertension, Normal cardiovascular exam Rhythm:regular Rate:Normal     Neuro/Psych negative neurological ROS  negative psych ROS   GI/Hepatic negative GI ROS, Neg liver ROS,   Endo/Other  negative endocrine ROS  Renal/GU negative Renal ROS  negative genitourinary   Musculoskeletal   Abdominal   Peds  Hematology negative hematology ROS (+)   Anesthesia Other Findings   Reproductive/Obstetrics (+) Pregnancy                             Anesthesia Physical Anesthesia Plan  ASA: III  Anesthesia Plan: Epidural   Post-op Pain Management:    Induction:   PONV Risk Score and Plan:   Airway Management Planned: Natural Airway  Additional Equipment:   Intra-op Plan:   Post-operative Plan:   Informed Consent: I have reviewed the patients History and Physical, chart, labs and discussed the procedure including the risks, benefits and alternatives for the proposed anesthesia with the patient or authorized representative who has indicated his/her understanding and acceptance.     Dental Advisory Given  Plan Discussed with: Anesthesiologist  Anesthesia Plan Comments: (Labs checked- platelets confirmed with RN in room. Fetal heart tracing, per RN, reported to be stable enough for sitting procedure. Discussed epidural, and patient consents to the procedure:  included risk of possible headache,backache, failed  block, allergic reaction, and nerve injury. This patient was asked if she had any questions or concerns before the procedure started.)        Anesthesia Quick Evaluation

## 2020-03-13 ENCOUNTER — Encounter (HOSPITAL_COMMUNITY): Payer: Self-pay | Admitting: Obstetrics and Gynecology

## 2020-03-13 LAB — CBC
HCT: 36.9 % (ref 36.0–46.0)
Hemoglobin: 11.9 g/dL — ABNORMAL LOW (ref 12.0–15.0)
MCH: 29.7 pg (ref 26.0–34.0)
MCHC: 32.2 g/dL (ref 30.0–36.0)
MCV: 92 fL (ref 80.0–100.0)
Platelets: 279 10*3/uL (ref 150–400)
RBC: 4.01 MIL/uL (ref 3.87–5.11)
RDW: 13.2 % (ref 11.5–15.5)
WBC: 27 10*3/uL — ABNORMAL HIGH (ref 4.0–10.5)
nRBC: 0 % (ref 0.0–0.2)

## 2020-03-13 LAB — RPR: RPR Ser Ql: NONREACTIVE

## 2020-03-13 MED ORDER — DIPHENHYDRAMINE HCL 25 MG PO CAPS: 25.0000 mg | ORAL_CAPSULE | Freq: Four times a day (QID) | ORAL | Status: DC | PRN

## 2020-03-13 MED ORDER — ONDANSETRON HCL 4 MG/2ML IJ SOLN: 4.0000 mg | INTRAMUSCULAR | Status: DC | PRN

## 2020-03-13 MED ORDER — ACETAMINOPHEN 325 MG PO TABS
650.0000 mg | ORAL_TABLET | ORAL | Status: DC | PRN
  Administered 2020-03-13: 650 mg via ORAL
  Filled 2020-03-13: qty 2

## 2020-03-13 MED ORDER — SODIUM CHLORIDE 0.9 % IV SOLN
2.0000 g | Freq: Four times a day (QID) | INTRAVENOUS | Status: DC
  Administered 2020-03-13 (×2): 2 g via INTRAVENOUS
  Filled 2020-03-13 (×2): qty 2000

## 2020-03-13 MED ORDER — SIMETHICONE 80 MG PO CHEW: 80.0000 mg | CHEWABLE_TABLET | ORAL | Status: DC | PRN

## 2020-03-13 MED ORDER — ONDANSETRON HCL 4 MG PO TABS: 4.0000 mg | ORAL_TABLET | ORAL | Status: DC | PRN

## 2020-03-13 MED ORDER — PRENATAL MULTIVITAMIN CH: 1.0000 | ORAL_TABLET | Freq: Every day | ORAL | Status: DC

## 2020-03-13 MED ORDER — DOCUSATE SODIUM 100 MG PO CAPS
100.0000 mg | ORAL_CAPSULE | Freq: Two times a day (BID) | ORAL | Status: DC
  Administered 2020-03-13 – 2020-03-15 (×4): 100 mg via ORAL
  Filled 2020-03-13 (×4): qty 1

## 2020-03-13 MED ORDER — ZOLPIDEM TARTRATE 5 MG PO TABS: 5.0000 mg | ORAL_TABLET | Freq: Every evening | ORAL | Status: DC | PRN

## 2020-03-13 MED ORDER — BENZOCAINE-MENTHOL 20-0.5 % EX AERO
1.0000 "application " | INHALATION_SPRAY | CUTANEOUS | Status: DC | PRN
  Administered 2020-03-13: 1 via TOPICAL
  Filled 2020-03-13: qty 56

## 2020-03-13 MED ORDER — IBUPROFEN 600 MG PO TABS
600.0000 mg | ORAL_TABLET | Freq: Four times a day (QID) | ORAL | Status: DC
  Administered 2020-03-13 – 2020-03-15 (×8): 600 mg via ORAL
  Filled 2020-03-13 (×8): qty 1

## 2020-03-13 MED ORDER — COCONUT OIL OIL: 1.0000 "application " | TOPICAL_OIL | Status: DC | PRN

## 2020-03-13 MED ORDER — GENTAMICIN SULFATE 40 MG/ML IJ SOLN
5.0000 mg/kg | INTRAVENOUS | Status: DC
  Administered 2020-03-13: 440 mg via INTRAVENOUS
  Filled 2020-03-13 (×2): qty 11

## 2020-03-13 MED ORDER — DIBUCAINE (PERIANAL) 1 % EX OINT: 1.0000 "application " | TOPICAL_OINTMENT | CUTANEOUS | Status: DC | PRN

## 2020-03-13 MED ORDER — OXYCODONE HCL 5 MG PO TABS: 10.0000 mg | ORAL_TABLET | ORAL | Status: DC | PRN

## 2020-03-13 MED ORDER — WITCH HAZEL-GLYCERIN EX PADS: 1.0000 "application " | MEDICATED_PAD | CUTANEOUS | Status: DC | PRN

## 2020-03-13 MED ORDER — MAGNESIUM HYDROXIDE 400 MG/5ML PO SUSP: 30.0000 mL | ORAL | Status: DC | PRN

## 2020-03-13 MED ORDER — OXYCODONE HCL 5 MG PO TABS: 5.0000 mg | ORAL_TABLET | ORAL | Status: DC | PRN

## 2020-03-13 NOTE — Lactation Note (Signed)
This note was copied from a baby's chart. Lactation Consultation Note  Patient Name: Lauren Arroyo Date: 03/13/2020  baby boy Lauren Arroyo now three hours old early term infant.  Mom reports she took an online breastfeeding class.  Mom repots he latched for just a minute or so after birth and just a little earlier and did better but she isn't sure he is getting anything.  Taught mom hand expression and spoon feeding.  Large drops of colostrum easily expressed.  Infant started cuing.  Mom has short nipples that evert with stimulation. Attempt to latch him on right breast.  Showed mom how to manually evert nipple infant will latch and suck for a minute or two but will not maintain. Got manual pump.  Attempt to elongate nipple with manual pump and drops of colostrum obtained easily with pump.  Attempted to latch again after prepumping. He will still not maintain at this time.  Attempted to t cup nipple and he sucks a few more times but unable to maintain.  Hand expressed and spoon fed colostrum to him.  Infant took approximately 6 ml via spoon.   Urged continued STS and to feed on cue and/or 8-12 or more times day.  Urged mom to hand express and spoon fed drops of colostrum past breastfeeds and/or attempted breastfeeds.  Left mom STS with infant.  Infant content.  Mom reports she has DEBP for home use. Reviewed and left [facility] breastfeeding consultation services handout. Urged mom to call lactation as needed.            I Jaymz Traywick Thompson Caul 03/13/2020, 3:55 PM

## 2020-03-13 NOTE — Anesthesia Postprocedure Evaluation (Signed)
Anesthesia Post Note  Patient: Lauren Arroyo  Procedure(s) Performed: AN AD HOC LABOR EPIDURAL     Patient location during evaluation: Mother Baby Anesthesia Type: Epidural Level of consciousness: awake Pain management: satisfactory to patient Vital Signs Assessment: post-procedure vital signs reviewed and stable Respiratory status: spontaneous breathing Cardiovascular status: stable Anesthetic complications: no   No complications documented.  Last Vitals:  Vitals:   03/13/20 1415 03/13/20 1534  BP: (!) 147/85 (!) 135/91  Pulse: (!) 113 (!) 108  Resp: 20 20  Temp: 37.9 C 37 C  SpO2:      Last Pain:  Vitals:   03/13/20 1630  TempSrc:   PainSc: 0-No pain   Pain Goal: Patients Stated Pain Goal: 0 (03/12/20 1723)              Epidural/Spinal Function Cutaneous sensation: Normal sensation (03/13/20 1630)  Casimer Lanius

## 2020-03-13 NOTE — Progress Notes (Addendum)
Pushing x 1 hour with good effort. Feeling tired. Overnight - patient febrile 100.43F and fetal tachycardia - started on ampicillin/gentamicin for chorioamnionitis. She is currently afebrile.   SVE 10/100/+1, LOT Cat 1 tracing  Patient comfortable with epidural, will stop pushing for now. Plan for passive descent with peanut ball for ~ 1 hour.   Irene Pap, MD 03/13/20 8:18 AM

## 2020-03-14 LAB — CBC
HCT: 30.1 % — ABNORMAL LOW (ref 36.0–46.0)
Hemoglobin: 9.7 g/dL — ABNORMAL LOW (ref 12.0–15.0)
MCH: 29.8 pg (ref 26.0–34.0)
MCHC: 32.2 g/dL (ref 30.0–36.0)
MCV: 92.6 fL (ref 80.0–100.0)
Platelets: 224 10*3/uL (ref 150–400)
RBC: 3.25 MIL/uL — ABNORMAL LOW (ref 3.87–5.11)
RDW: 13.4 % (ref 11.5–15.5)
WBC: 14.2 10*3/uL — ABNORMAL HIGH (ref 4.0–10.5)
nRBC: 0 % (ref 0.0–0.2)

## 2020-03-14 MED ORDER — FERROUS SULFATE 325 (65 FE) MG PO TABS: 325.0000 mg | ORAL_TABLET | Freq: Every day | ORAL | Status: DC

## 2020-03-14 MED ORDER — FERROUS SULFATE 325 (65 FE) MG PO TABS
325.0000 mg | ORAL_TABLET | Freq: Every day | ORAL | Status: DC
  Administered 2020-03-14 – 2020-03-15 (×2): 325 mg via ORAL
  Filled 2020-03-14 (×2): qty 1

## 2020-03-14 MED ORDER — FERROUS SULFATE 325 (65 FE) MG PO TABS: 325.0000 mg | ORAL_TABLET | Freq: Two times a day (BID) | ORAL | Status: DC

## 2020-03-14 NOTE — Lactation Note (Signed)
This note was copied from a baby's chart. Lactation Consultation Note  Patient Name: Lauren Arroyo [id number] Date: 03/14/2020 Reason for consult: Follow-up assessment;Mother's request;Primapara;1st time breastfeeding;Early term 74-38.6wks 70 13hrs old, called to room by RN, states mom needs a nipple shield between size 20 and 24, advised we do not carry an in between size, try 24. Mom resting in bed, dad holding sleeping baby, mom voiced dedicated to breastfeeding, would like help with latching and applying nipple shield. Mom has her own nipple shields in room (sizes 20 and 24), reports with a [employer] DEBP at home. Mom with short nipples bilat, LC pre-pumped with hand pump and applied 30mm shield. Attempted to latch baby to right breast football hold, baby latched and sucked few times with stimulation and fell asleep. LC taught hand expression, obtained 1-2 drops bilat and finger fed back to baby with gloved finger. Mom setup with DEBP, reviewed pump frequency, cleanup and milk storage guidelines.  Discussed cue based feedings, wake if >3hrs since last feeding, expect 8-12 in 24hrs, skin to skin, pump and hand express after each feeding, cluster feeding. Mom voiced understanding and with no further concerns. Left the room with mom resting in bed holding sleeping baby. BGilliam, RN, IBCLC  Plan - feed on cue, wake if >3hrs since last feeding - apply 29mm nipple shield, apply 1-1ml colostrum to tip of shield before latching - pump and hand express after each feeding, offer EBM back to baby via spoon  - call for LC/ RN support if with difficulty latching  Maternal Data    Feeding Feeding Type: Breast Fed  LATCH Score Latch: Repeated attempts needed to sustain latch, nipple held in mouth throughout feeding, stimulation needed to elicit sucking reflex.  Audible Swallowing: None  Type of Nipple: Everted at rest and after stimulation (short)  Comfort (Breast/Nipple): Soft /  non-tender  Hold (Positioning): Assistance needed to correctly position infant at breast and maintain latch.  LATCH Score: 6  Interventions Interventions: Breast feeding basics reviewed;Assisted with latch;Skin to skin;Breast massage;Hand express;Pre-pump if needed;Adjust position;Support pillows;Position options;Expressed milk;DEBP  Lactation Tools Discussed/Used Tools: Nipple Shields Nipple shield size: 20 Pump Review: Setup, frequency, and cleaning;Milk Storage Initiated by:: Lauren Kras, RN, IBCLC Date initiated:: 03/13/20   Consult Status      Lauren Arroyo 03/14/2020, 12:03 AM

## 2020-03-14 NOTE — Lactation Note (Signed)
This note was copied from a baby's chart. Lactation Consultation Note  Patient Name: Lauren Arroyo EYEMV'V Date: 03/14/2020 Reason for consult: Mother's request;Difficult latch;Early term 37-38.6wks;1st time breastfeeding P1, mom requesting LC services due to infant not sustaining latch and mom having flat nipples.  Per mom, infant BF less than one hour ago was supplemented with formula LC saw pacifier in crib.  Mom was given NS due to having flat nipples but unsure of correct size, LC fitted mom with 20 mm NS. Mom's current plan: 1. Mom will BF infant according to cues, 8 to 12+ times within 24 hours. 2. Mom will pre-pump breast, then apply 20 mm NS and pre-fill NS with 0.5 mls of formula. 3. Afterwards mom will supplement infant with 7-12 mls of formula using curve tip syringe. 4. Mom will continue to use DEBP and pump every 3 hours for 15 minutes on initial setting. 47. Mom will call LC to help assist with next infant feeding.  Maternal Data    Feeding Feeding Type: Bottle Fed - Formula  LATCH Score                   Interventions    Lactation Tools Discussed/Used     Consult Status Consult Status: Follow-up Date: 03/15/20 Follow-up type: In-patient    Vicente Serene 03/14/2020, 10:55 PM

## 2020-03-14 NOTE — Progress Notes (Signed)
Post Partum Day 1 Subjective: no complaints, up ad lib, voiding and tolerating PO  Objective: Patient Vitals for the past 24 hrs:  BP Temp Temp src Pulse Resp SpO2  03/14/20 0555 118/81 97.7 F (36.5 C) Oral 82 16 97 %  03/14/20 0247 118/83 -- -- 99 17 98 %  03/13/20 2132 124/76 97.8 F (36.6 C) Oral (!) 104 16 97 %  03/13/20 1814 123/74 97.9 F (36.6 C) Oral (!) 107 20 --  03/13/20 1534 (!) 135/91 98.6 F (37 C) -- (!) 108 20 --  03/13/20 1415 (!) 147/85 100.3 F (37.9 C) Axillary (!) 113 20 --  03/13/20 1351 134/80 -- -- (!) 118 -- --  03/13/20 1331 94/79 -- -- (!) 109 17 --  03/13/20 1316 129/83 -- -- (!) 106 -- --  03/13/20 1301 122/78 -- -- (!) 115 -- --  03/13/20 1250 126/75 98 F (36.7 C) Oral (!) 107 -- --  03/13/20 1232 127/79 -- -- 96 -- --  03/13/20 1216 120/86 -- -- (!) 103 16 --  03/13/20 1201 104/85 -- -- (!) 112 -- --  03/13/20 1147 118/74 -- -- (!) 108 -- --  03/13/20 1131 131/80 -- -- (!) 108 17 --  03/13/20 1116 129/83 -- -- (!) 108 -- --  03/13/20 1115 -- -- -- -- 17 --  03/13/20 1100 (!) 156/146 -- -- -- -- --  03/13/20 1050 -- -- -- -- -- 100 %  03/13/20 1048 -- -- -- -- -- 100 %  03/13/20 1040 -- -- -- -- -- 100 %  03/13/20 1035 -- -- -- -- -- 100 %  03/13/20 1031 (!) 150/56 -- -- 91 -- --  03/13/20 1002 -- 98 F (36.7 C) Oral -- -- --  03/13/20 1000 123/82 -- -- (!) 101 -- --  03/13/20 0901 116/73 -- -- (!) 110 18 --  03/13/20 0831 120/70 -- -- (!) 106 -- --  03/13/20 0801 108/85 -- -- (!) 111 16 --    Physical Exam:  General: alert, cooperative and no distress Lochia: appropriate Uterine Fundus: firm DVT Evaluation: No evidence of DVT seen on physical exam.  Recent Labs    03/12/20 1044 03/12/20 1832 03/13/20 1311 03/14/20 0459  WBC 11.0* 12.8* 27.0* 14.2*  HGB 12.9 11.8* 11.9* 9.7*  HCT 39.7 36.1 36.9 30.1*  PLT 331 298 279 224    Recent Labs    03/12/20 1044  NA 137  K 4.1  CL 108  BUN 7  CREATININE 0.59  GLUCOSE 88   BILITOT 0.6  ALT 20  AST 26  ALKPHOS 183*  PROT 5.9*  ALBUMIN 2.9*    Recent Labs    03/12/20 1044  CALCIUM 9.0    No results for input(s): PROTIME, APTT, INR in the last 72 hours.  No results for input(s): PROTIME, APTT, INR, FIBRINOGEN in the last 72 hours. Assessment/Plan: Plan for discharge tomorrow  Lauren Arroyo 29 y.o. G1P1001 PPD#1 sp SVD. 1. PPC: continue routine postpartum care 2. Anemia: H/H 9.7/30.1, start iron PO BID 3. Gestational hypertension: normotensive overnight 4. Intrapartum course complicated by chorioamnionitis: s/p amp/gent, afebrile since delivery 5. Desires circumcision: Desires neonatal circumcision, R/B/A of procedure discussed at length. Pt understands that neonatal circumcision is not considered medically necessary and is elective. The risks include, but are not limited to bleeding, infection, damage to the penis, development of scar tissue, and having to have it redone at a later date. Pt understands theses risks and wishes  to proceed.  6. Rh pos    LOS: 2 days   Rowland Lathe 03/14/2020, 7:32 AM

## 2020-03-14 NOTE — Lactation Note (Signed)
This note was copied from a baby's chart. Lactation Consultation Note  Patient Name: Boy Myalynn Lingle Today's Date: 03/14/2020  baby boy cooper recently returned from [place].  Parents report he is not going to eat now.   Mom started formula in the night due to latch difficulties.  Mom reports she feels she is between nipple shield sized.  Mom reports the 20 is to tight and the 24 is too big.  Discussed running nipple shield under hot water to stretch it really well and evert nipple with pump prior to applying nipple shield.  Urged to make sure stretch halfway inside out really well to help evert nipple into the shield. Discussed if that did not work that [employer] did not make a 22 nipple shield however a company called [employer] Mom reports for right now she thinks she will just do some pumping and breastmilk and formula  from bottle. Praised efforts. urged to try and pump 8-12 times day/ideal 10 for 15 minutes to establish healthy supply and add massage and hand expression.  Urged to call lactation as needed.    Maternal Data    Feeding Feeding Type: Bottle Fed - Formula  LATCH Score                   Interventions    Lactation Tools Discussed/Used     Consult Status      Sherwin Hollingshed Thompson Caul 03/14/2020, 10:19 AM

## 2020-03-15 NOTE — Discharge Summary (Signed)
Postpartum Discharge Summary     Patient Name: Lauren Arroyo DOB: 1990/08/23 MRN: [id number]  Date of admission: 03/12/2020 Delivery date:03/13/2020  Delivering provider: Irene Pap E  Date of discharge: 03/15/2020  Admitting diagnosis: Gestational hypertension [O13.9] Intrauterine pregnancy: [redacted]w[redacted]d    Secondary diagnosis:  Active Problems:   Gestational hypertension  Additional problems: PROM    Discharge diagnosis: Term Pregnancy Delivered                                              Post partum procedures:none Augmentation: Pitocin and Cytotec Complications: None  Hospital course: Onset of Labor With Vaginal Delivery      29y.o. yo G1P1001 at 314w5das admitted in Latent Labor on 03/12/2020. Patient had an uncomplicated labor course as follows:  Membrane Rupture Time/Date: 3:17 PM ,03/12/2020   Delivery Method:Vaginal, Spontaneous  Episiotomy: None  Lacerations:  2nd degree  Patient had an uncomplicated postpartum course.  She is ambulating, tolerating a regular diet, passing flatus, and urinating well. Patient is discharged home in stable condition on 03/15/20.  Newborn Data: Birth date:03/13/2020  Birth time:10:54 AM  Gender:Female  Living status:Living  Apgars:8 ,9  Weight:3235 g   Magnesium Sulfate received: No BMZ received: No Rhophylac:N/A MMR:N/A T-DaP:see office note Flu: N/A Transfusion:No  Physical exam  Vitals:   03/14/20 0555 03/14/20 1814 03/14/20 2300 03/15/20 0546  BP: 118/81 126/85 122/80 109/90  Pulse: 82 92 98 97  Resp: '16 20 18   ' Temp: 97.7 F (36.5 C) 98.1 F (36.7 C) 98.1 F (36.7 C) 98.1 F (36.7 C)  TempSrc: Oral   Oral  SpO2: 97%     Weight:      Height:       General: alert and cooperative Lochia: appropriate Uterine Fundus: firm DVT Evaluation: No evidence of DVT seen on physical exam. Labs: Lab Results  Component Value Date   WBC 14.2 (H) 03/14/2020   HGB 9.7 (L) 03/14/2020   HCT 30.1 (L) 03/14/2020    MCV 92.6 03/14/2020   PLT 224 03/14/2020   CMP Latest Ref Rng & Units 03/12/2020  Glucose 70 - 99 mg/dL 88  BUN 6 - 20 mg/dL 7  Creatinine 0.44 - 1.00 mg/dL 0.59  Sodium 135 - 145 mmol/L 137  Potassium 3.5 - 5.1 mmol/L 4.1  Chloride 98 - 111 mmol/L 108  CO2 22 - 32 mmol/L 20(L)  Calcium 8.9 - 10.3 mg/dL 9.0  Total Protein 6.5 - 8.1 g/dL 5.9(L)  Total Bilirubin 0.3 - 1.2 mg/dL 0.6  Alkaline Phos 38 - 126 U/L 183(H)  AST 15 - 41 U/L 26  ALT 0 - 44 U/L 20   Edinburgh Score: Edinburgh Postnatal Depression Scale Screening Tool 03/14/2020  I have been able to laugh and see the funny side of things. 0  I have looked forward with enjoyment to things. 0  I have blamed myself unnecessarily when things went wrong. 0  I have been anxious or worried for no good reason. 0  I have felt scared or panicky for no good reason. 0  Things have been getting on top of me. 0  I have been so unhappy that I have had difficulty sleeping. 0  I have felt sad or miserable. 0  I have been so unhappy that I have been crying. 0  The thought of harming  myself has occurred to me. 0  Edinburgh Postnatal Depression Scale Total 0      After visit meds:  Allergies as of 03/15/2020      Reactions   Bactrim [sulfamethoxazole-trimethoprim] Rash      Medication List    TAKE these medications   prenatal multivitamin Tabs tablet Take 1 tablet by mouth daily at 12 noon.        Discharge home in stable condition Infant Feeding: see lactation note Infant Disposition:home with mother Discharge instruction: per After Visit Summary and Postpartum booklet. Activity: Advance as tolerated. Pelvic rest for 6 weeks.  Diet: routine diet Anticipated Birth Control: Unsure Postpartum Appointment:4 weeks Additional Postpartum F/U: Postpartum Depression checkup Future Appointments:No future appointments. Follow up Visit:  Follow-up Information    Rowland Lathe, MD Follow up in 4 week(s).   Specialty:  Obstetrics and Gynecology Contact information: [address] [place] [address] [phone]                   03/15/2020 Allyn Kenner, DO

## 2020-03-15 NOTE — Lactation Note (Signed)
This note was copied from a baby's chart. Lactation Consultation Note  Patient Name: Lauren Arroyo Date: 03/15/2020 Reason for consult: Follow-up assessment    LC arrived to check infants latch. Mother is using a nipple shield. She needed very little assistance with placement of nipple shield or positioning infant in football hold. Infant latched on with lips flanged . Lips not wide enough she is using a #20 and hand a #22 . Assist mother to flange infants lips for wider gape.  Infant sustained latch on and off for 15 mins and then father was going to feed infant formula with a curved tip syringe. According to supplemental guidelines.   Taught mother the asymmetrical latch technique and infant latched deeper on initial latch. Mother to continue to cue base feed infant and feed at least 8-12 times or more in 24 hours and advised to allow for cluster feeding infant as needed.   Mother to continue to due STS. Mother is aware of available LC services at [facility], BFSG'S, OP Dept, and phone # for questions or concerns about breastfeeding.  Mother receptive to all teaching and plan of care.     Maternal Data    Feeding Feeding Type: Breast Fed  LATCH Score Latch: Grasps breast easily, tongue down, lips flanged, rhythmical sucking.  Audible Swallowing: A few with stimulation (obs small amt of colostrum in the shield)  Type of Nipple: Everted at rest and after stimulation  Comfort (Breast/Nipple): Soft / non-tender  Hold (Positioning): Assistance needed to correctly position infant at breast and maintain latch.  LATCH Score: 8  Interventions Interventions: Assisted with latch;Skin to skin;Breast compression;Support pillows;Position options  Lactation Tools Discussed/Used     Consult Status Consult Status: Complete Date: 03/15/20 Follow-up type: In-patient    Jess Barters [facility] 03/15/2020, 11:49 AM

## 2020-03-15 NOTE — Lactation Note (Signed)
This note was copied from a baby's chart. Lactation Consultation Note  Patient Name: [place] Date: 03/15/2020 Reason for consult: Follow-up assessment   P1, Baby 67 hours old.  Mother pumping upon entering due to using NS.  Mother is breastfeeding, pumping and supplementing with formula.  Mother is using #20NS but has ordered size 22 from [employer]. Suggest LC view next feeding. Discussed pumping frequency.  Mother is pumping with each feeding. Suggest if baby is feeding well at the breast which will be determined during consult, mother can start pumping after every other feeding. Mother has DEBP at home.    Maternal Data    Feeding Feeding Type: Bottle Fed - Formula  LATCH Score                   Interventions Interventions: Breast feeding basics reviewed;DEBP  Lactation Tools Discussed/Used     Consult Status Consult Status: Follow-up Date: 03/15/20 Follow-up type: In-patient    Vivianne Master [facility] 03/15/2020, 9:50 AM

## 2020-03-15 NOTE — Lactation Note (Signed)
This note was copied from a baby's chart. Lactation Consultation Note  Patient Name: [name] Today's Date: 03/15/2020  P1, 40 hours ETI female infant -4% weigh loss. Per mom, infant latched with 20 mm NS and BF for 20 minutes afterwards infant took 12 mls of formula using slow flow bottle nipple. RN assisted with latch.  Mom will continue with feeding plan as advised by LC earlier.  Mom knows to call RN or LC if she has questions, concerns or needs assistance with latching infant at the breast.  Maternal Data    Feeding Feeding Type: Breast Milk with Formula added  LATCH Score Latch: Repeated attempts needed to sustain latch, nipple held in mouth throughout feeding, stimulation needed to elicit sucking reflex.  Audible Swallowing: Spontaneous and intermittent  Type of Nipple: Flat  Comfort (Breast/Nipple): Soft / non-tender  Hold (Positioning): Assistance needed to correctly position infant at breast and maintain latch.  LATCH Score: 7  Interventions    Lactation Tools Discussed/Used Nipple shield size: 20   Consult Status      Lauren Arroyo 03/15/2020, 2:57 AM

## 2020-03-31 ENCOUNTER — Other Ambulatory Visit: Payer: Self-pay | Admitting: Obstetrics and Gynecology

## 2020-03-31 DIAGNOSIS — R519 Headache, unspecified: Secondary | ICD-10-CM

## 2020-04-01 ENCOUNTER — Encounter: Payer: Self-pay | Admitting: Genetics

## 2020-04-02 ENCOUNTER — Ambulatory Visit
Admission: RE | Admit: 2020-04-02 | Discharge: 2020-04-02 | Disposition: A | Discharge status: Home / Self Care | Payer: 59 | Source: Ambulatory Visit | Attending: Obstetrics and Gynecology | Admitting: Obstetrics and Gynecology

## 2020-04-02 ENCOUNTER — Emergency Department (HOSPITAL_COMMUNITY)
Admission: EM | Admit: 2020-04-02 | Discharge: 2020-04-02 | Disposition: A | Discharge status: Home / Self Care | Payer: 59 | Attending: Emergency Medicine | Admitting: Emergency Medicine

## 2020-04-02 ENCOUNTER — Other Ambulatory Visit: Payer: Self-pay

## 2020-04-02 ENCOUNTER — Emergency Department (HOSPITAL_COMMUNITY): Payer: 59

## 2020-04-02 DIAGNOSIS — R519 Headache, unspecified: Secondary | ICD-10-CM | POA: Diagnosis not present

## 2020-04-02 DIAGNOSIS — E236 Other disorders of pituitary gland: Secondary | ICD-10-CM | POA: Diagnosis not present

## 2020-04-02 DIAGNOSIS — O9089 Other complications of the puerperium, not elsewhere classified: Secondary | ICD-10-CM | POA: Insufficient documentation

## 2020-04-02 LAB — COMPREHENSIVE METABOLIC PANEL
ALT: 28 U/L (ref 0–44)
AST: 25 U/L (ref 15–41)
Albumin: 3.6 g/dL (ref 3.5–5.0)
Alkaline Phosphatase: 103 U/L (ref 38–126)
Anion gap: 9 (ref 5–15)
BUN: 14 mg/dL (ref 6–20)
CO2: 24 mmol/L (ref 22–32)
Calcium: 9.4 mg/dL (ref 8.9–10.3)
Chloride: 106 mmol/L (ref 98–111)
Creatinine, Ser: 0.69 mg/dL (ref 0.44–1.00)
GFR, Estimated: >60 mL/min (ref >60)
Glucose, Bld: 98 mg/dL (ref 70–99)
Potassium: 4.2 mmol/L (ref 3.5–5.1)
Sodium: 139 mmol/L (ref 135–145)
Total Bilirubin: 0.5 mg/dL (ref 0.3–1.2)
Total Protein: 6.6 g/dL (ref 6.5–8.1)

## 2020-04-02 LAB — CBC WITH DIFFERENTIAL/PLATELET
Abs Immature Granulocytes: 0.02 10*3/uL (ref 0.00–0.07)
Basophils Absolute: 0 10*3/uL (ref 0.0–0.1)
Basophils Relative: 0 %
Eosinophils Absolute: 0.5 10*3/uL (ref 0.0–0.5)
Eosinophils Relative: 6 %
HCT: 41.4 % (ref 36.0–46.0)
Hemoglobin: 13.1 g/dL (ref 12.0–15.0)
Immature Granulocytes: 0 %
Lymphocytes Relative: 34 %
Lymphs Abs: 2.5 10*3/uL (ref 0.7–4.0)
MCH: 29.2 pg (ref 26.0–34.0)
MCHC: 31.6 g/dL (ref 30.0–36.0)
MCV: 92.2 fL (ref 80.0–100.0)
Monocytes Absolute: 0.6 10*3/uL (ref 0.1–1.0)
Monocytes Relative: 8 %
Neutro Abs: 3.7 10*3/uL (ref 1.7–7.7)
Neutrophils Relative %: 52 %
Platelets: 356 10*3/uL (ref 150–400)
RBC: 4.49 MIL/uL (ref 3.87–5.11)
RDW: 12.8 % (ref 11.5–15.5)
WBC: 7.3 10*3/uL (ref 4.0–10.5)
nRBC: 0 % (ref 0.0–0.2)

## 2020-04-02 LAB — TSH: TSH: 1.159 u[IU]/mL (ref 0.350–4.500)

## 2020-04-02 LAB — CORTISOL: Cortisol, Plasma: 2.8 ug/dL

## 2020-04-02 LAB — T4, FREE: Free T4: 0.98 ng/dL (ref 0.61–1.12)

## 2020-04-02 IMAGING — MR MR MRV HEAD W/O CM
3 series · 48 of 48 positions shown · non-contrast
Comparison: MRI head [DATE]

CLINICAL DATA: Headaches.  Two weeks post partum.

EXAM:
MR VENOGRAM OF THE HEAD WITHOUT CONTRAST
TECHNIQUE: Angiographic images of the intracranial venous structures were
obtained using MRV technique without intravenous contrast.

[Series 1: flow_pc3d_sag_p3_(id)_sinus · sagittal · 1.0mm · 0.94mm/px · 18 of 144 slices shown]
[im 1/144]
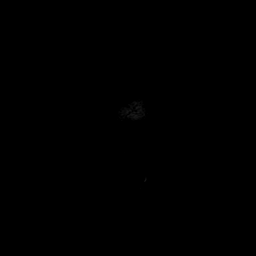
[im 9/144]
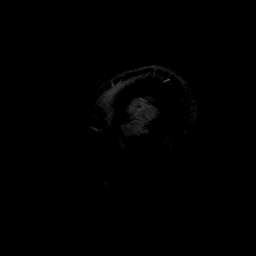
[im 17/144]
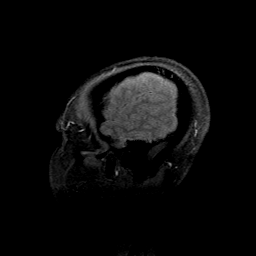
[im 26/144]
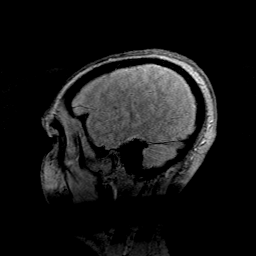
[im 34/144]
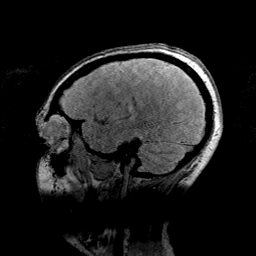
[im 43/144]
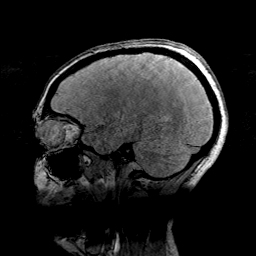
[im 51/144]
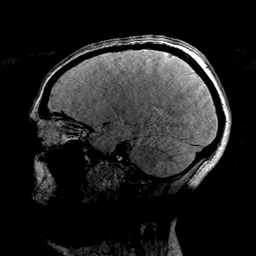
[im 59/144]
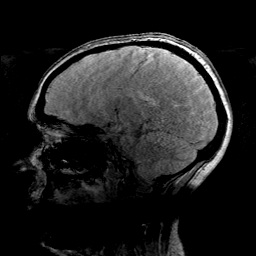
[im 68/144]
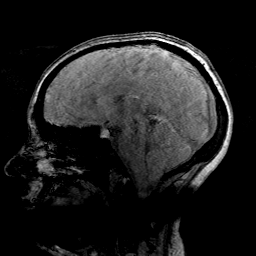
[im 76/144]
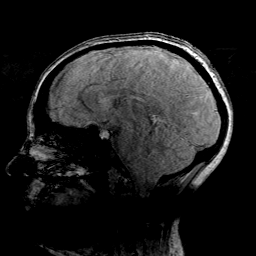
[im 85/144]
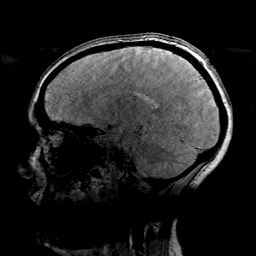
[im 93/144]
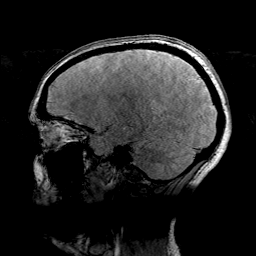
[im 101/144]
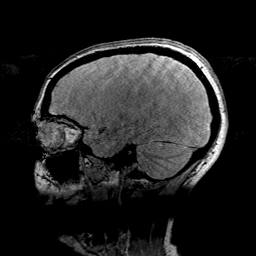
[im 110/144]
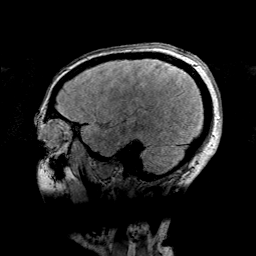
[im 118/144]
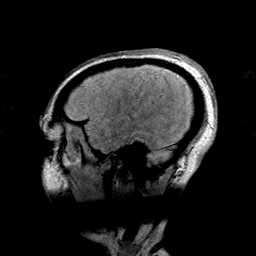
[im 127/144]
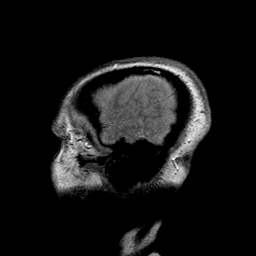
[im 135/144]
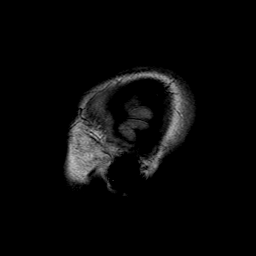
[im 144/144]
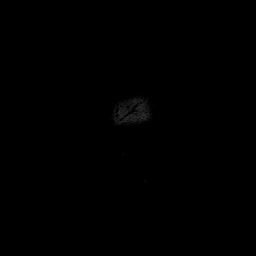

[Series 2: flow_pc3d_sag_p3_(id)_sinus_msum · sagittal · 1.0mm · 0.94mm/px · 17 of 144 slices shown]
[im 1/144]
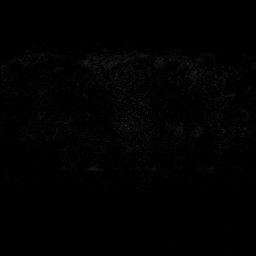
[im 9/144]
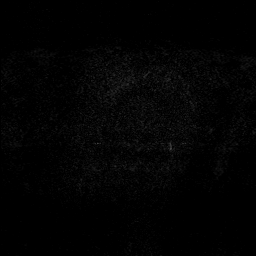
[im 18/144]
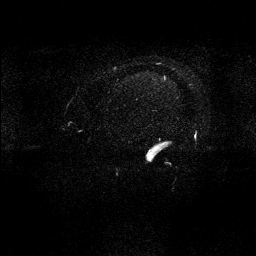
[im 27/144]
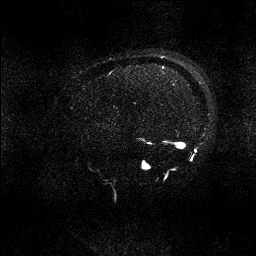
[im 36/144]
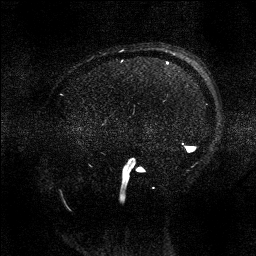
[im 45/144]
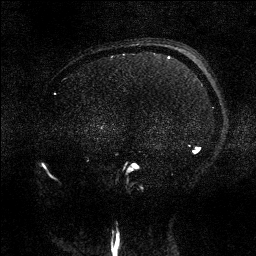
[im 54/144]
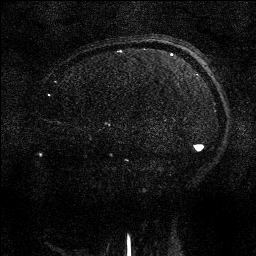
[im 63/144]
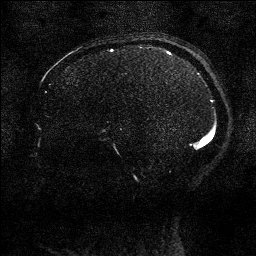
[im 72/144]
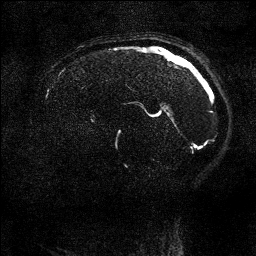
[im 81/144]
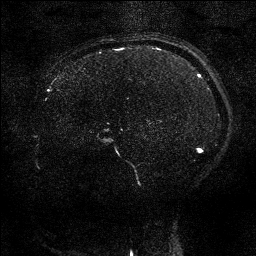
[im 90/144]
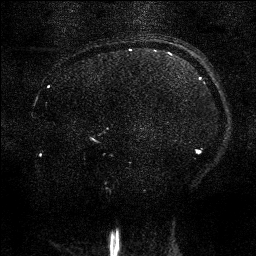
[im 99/144]
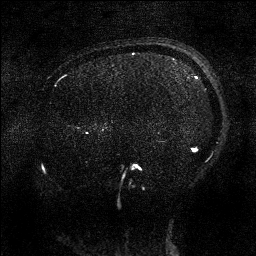
[im 108/144]
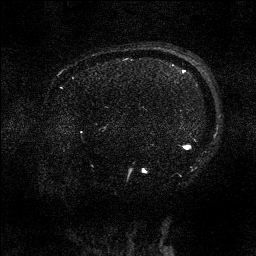
[im 117/144]
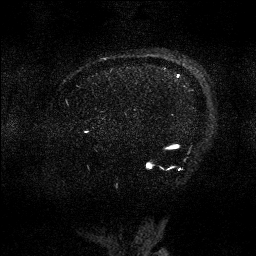
[im 126/144]
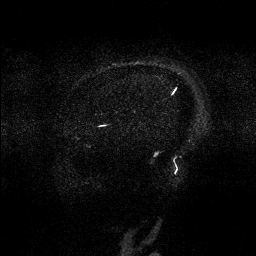
[im 135/144]
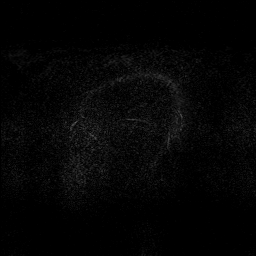
[im 144/144]
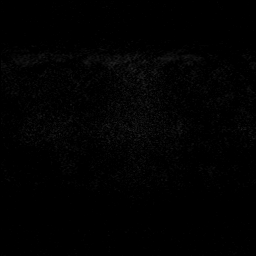

[Series 5: TOF · coronal · 2.5mm · 0.98mm/px · 13 of 112 slices shown]
[im 1/112]
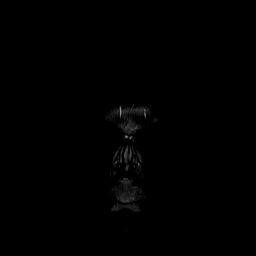
[im 10/112]
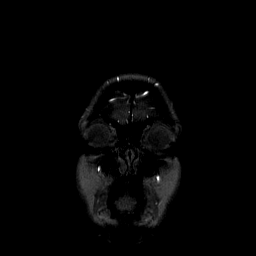
[im 19/112]
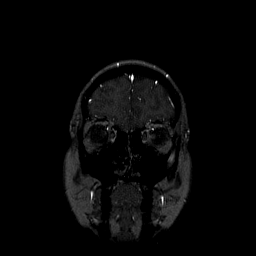
[im 28/112]
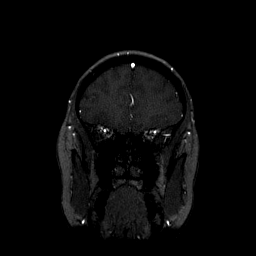
[im 38/112]
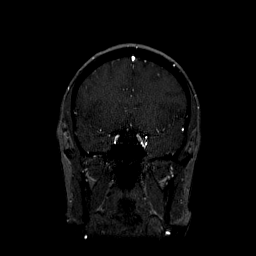
[im 47/112]
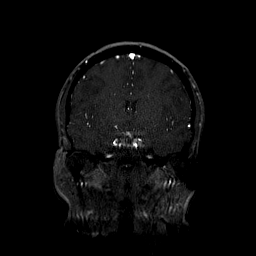
[im 56/112]
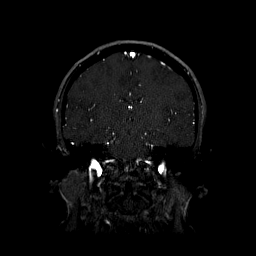
[im 65/112]
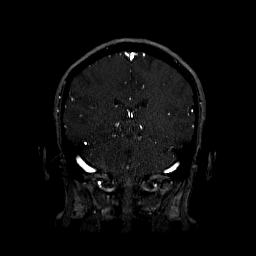
[im 75/112]
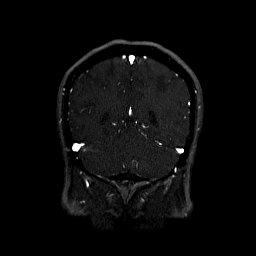
[im 84/112]
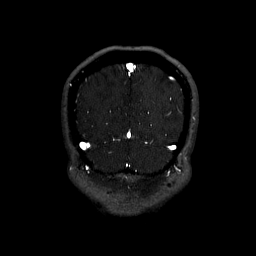
[im 93/112]
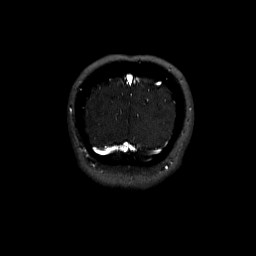
[im 102/112]
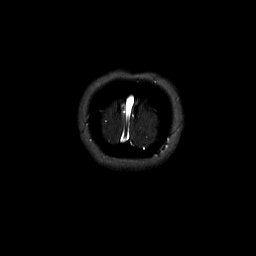
[im 112/112]
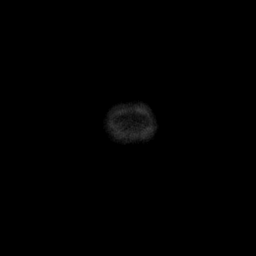

[48 of 48 positions shown; findings below may reference images not displayed]

FINDINGS: Dural venous sinuses are patent bilaterally. No occlusion or venous
sinus thrombosis. Superior sagittal sinus widely patent. Transverse
and sigmoid sinus patent bilaterally. Straight sinus patent.
IMPRESSION: Normal dural venous sinuses.

## 2020-04-02 IMAGING — MR MR HEAD W/O CM
10 series · 48 of 48 positions shown · non-contrast
Comparison: None.
COMPARISON: None.

Addendum:
CLINICAL DATA: Two weeks post partum. Preeclampsia. Headaches since
giving birth.

EXAM:
MRI HEAD WITHOUT CONTRAST
TECHNIQUE: Multiplanar, intravenous contrast.

[Series 5: T1 · sagittal · 4.0mm · 0.75mm/px · 4 of 31 slices shown (1 of 2)]
[im 1/31]
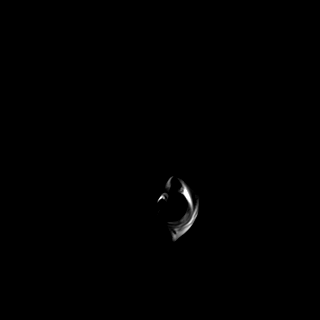
[im 11/31]
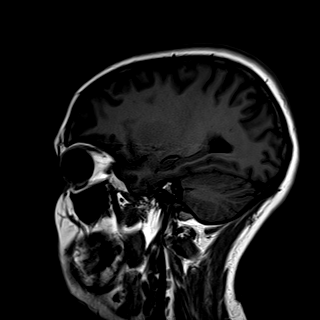
[im 21/31]
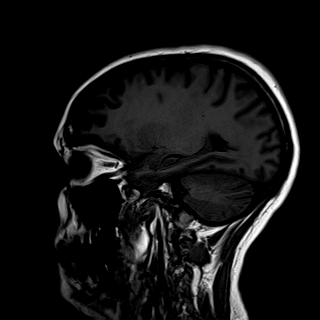
[im 31/31]
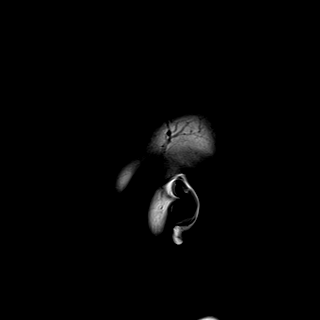

[Series 6: DWI · axial · 3.0mm · 1.44mm/px · z∈[-52,+83]mm · 7 of 84 slices shown (1 of 4)]
[im 1/84]
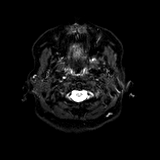
[im 14/84]
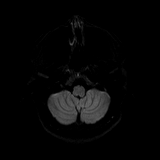
[im 28/84]
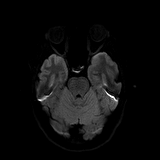
[im 42/84]
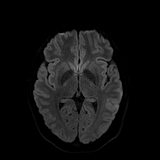
[im 56/84]
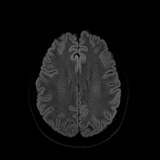
[im 70/84]
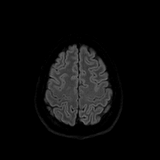
[im 84/84]
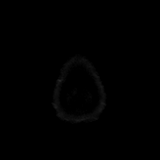

[Series 7: DWI · axial · 3.0mm · 1.44mm/px · z∈[-52,+83]mm · 3 of 42 slices shown (2 of 4)]
[im 1/42]
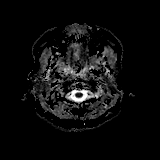
[im 21/42]
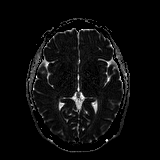
[im 42/42]
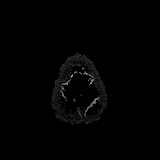

[Series 8: DWI · coronal · 5.0mm · 1.44mm/px · 5 of 60 slices shown (3 of 4)]
[im 1/60]
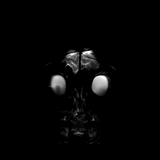
[im 15/60]
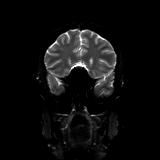
[im 30/60]
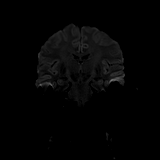
[im 45/60]
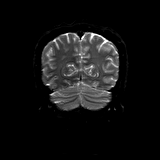
[im 60/60]
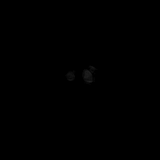

[Series 9: DWI · coronal · 5.0mm · 1.44mm/px · 2 of 30 slices shown (4 of 4)]
[im 1/30]
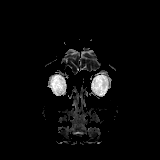
[im 30/30]
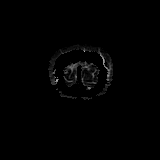

[Series 10: T2 · axial · 4.0mm · 0.36mm/px · z∈[-55,+80]mm · 2 of 27 slices shown (1 of 2)]
[im 1/27]
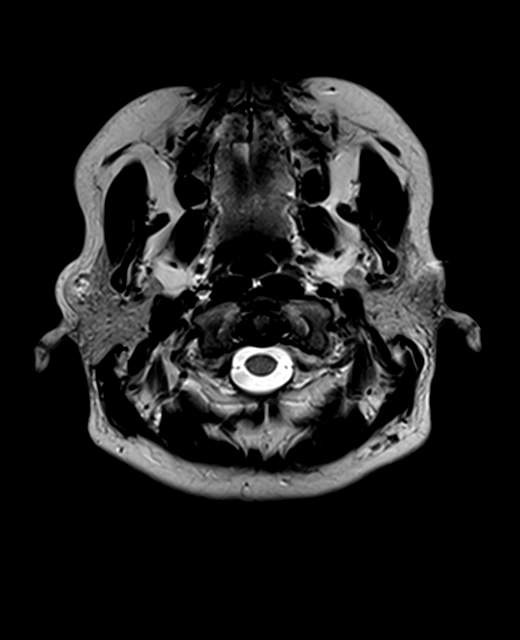
[im 27/27]
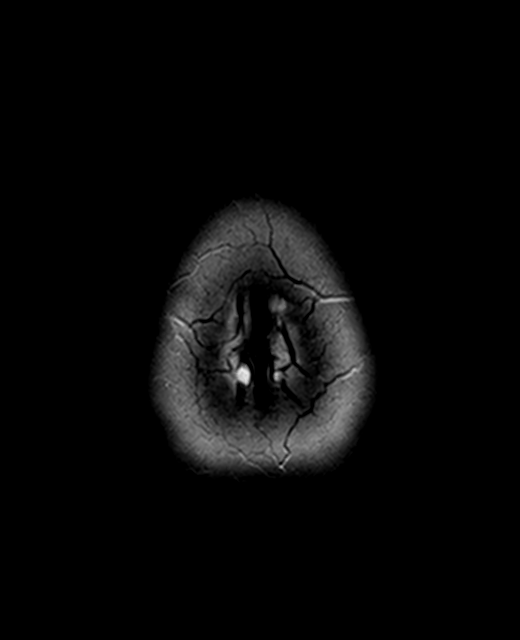

[Series 11: FLAIR · axial · 3.0mm · 0.72mm/px · z∈[-62,+88]mm · 2 of 26 slices shown]
[im 1/26]
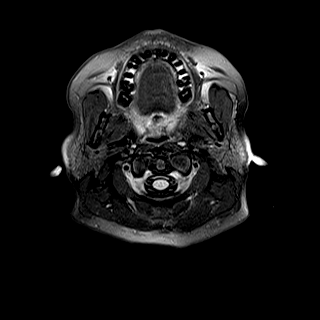
[im 26/26]
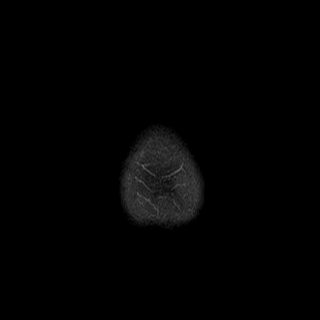

[Series 13: swi_images · axial · 1.5mm · 0.90mm/px · z∈[-59,+83]mm · 8 of 96 slices shown]
[im 1/96]
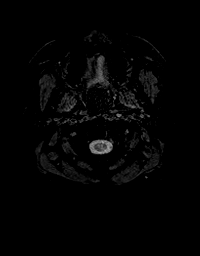
[im 14/96]
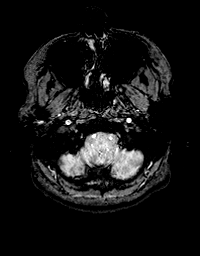
[im 28/96]
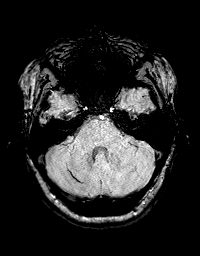
[im 41/96]
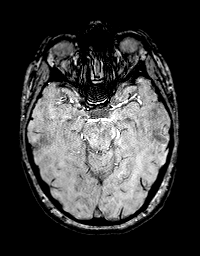
[im 55/96]
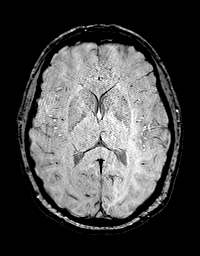
[im 68/96]
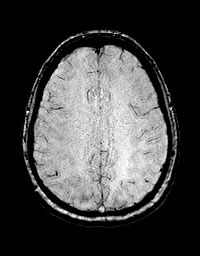
[im 82/96]
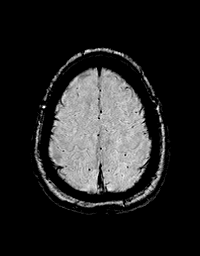
[im 96/96]
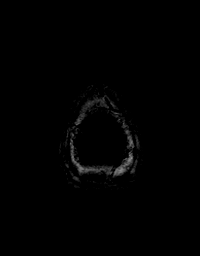

[Series 14: T1 · axial · 1.0mm · 0.94mm/px · z∈[-67,+92]mm · 13 of 160 slices shown (2 of 2)]
[im 1/160]
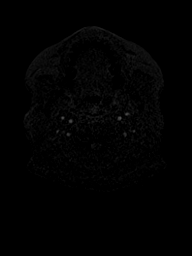
[im 14/160]
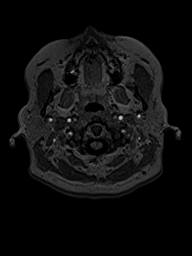
[im 27/160]
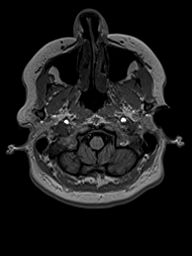
[im 40/160]
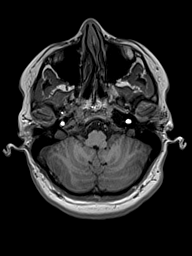
[im 54/160]
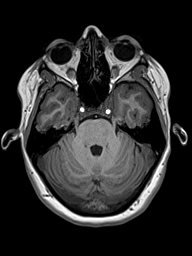
[im 67/160]
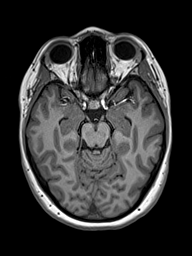
[im 80/160]
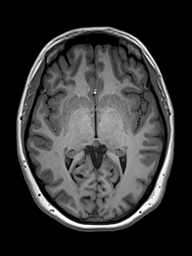
[im 93/160]
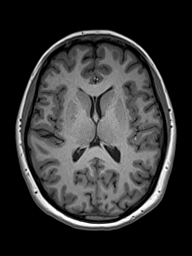
[im 107/160]
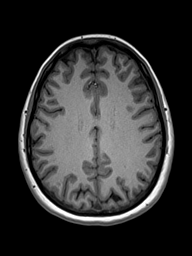
[im 120/160]
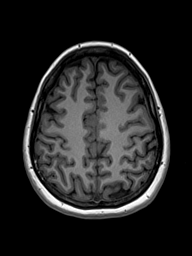
[im 133/160]
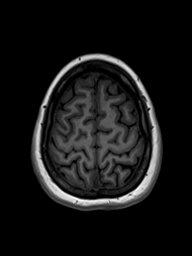
[im 146/160]
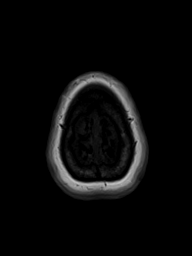
[im 160/160]
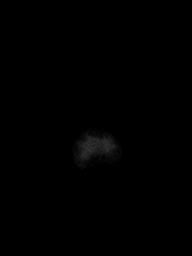

[Series 15: T2 · coronal · 4.5mm · 0.36mm/px · 2 of 30 slices shown (2 of 2)]
[im 1/30]
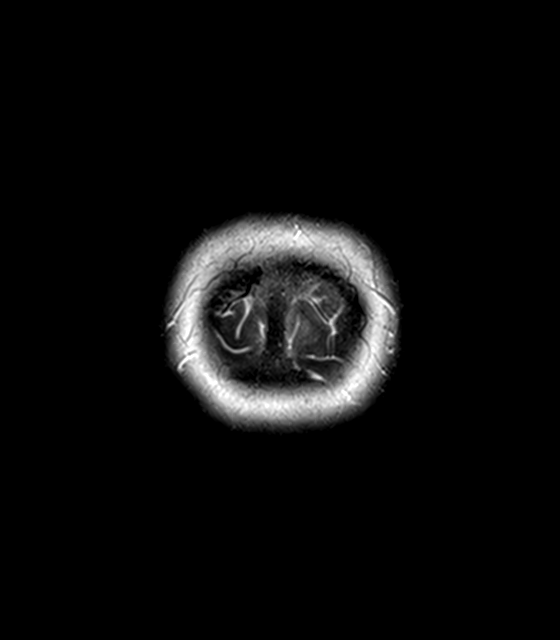
[im 30/30]
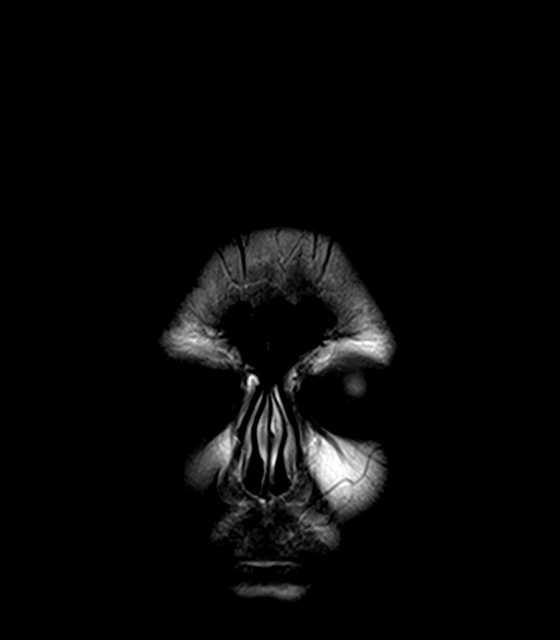

[48 of 48 positions shown; findings below may reference images not displayed]

FINDINGS: Brain: Pituitary enlarged 11 mm. Heterogeneous signal intensity with
a focus of hyperintense T1 and hypointense on T2 signal inferiorly.
The majority of the pituitary is hypointense on T1 and hyperintense
on T2. The large pituitary is touching the undersurface of the optic
chiasm without compression of the chiasm.

Ventricle size normal. Negative for acute infarct, negative for
acute infarct. Small hyperintensity in the left frontal white
matter. Normal brainstem and cerebellum.

Vascular: Normal arterial flow voids.

Skull and upper cervical spine: No focal skeletal lesion.

Sinuses/Orbits: Paranasal sinuses clear.  Negative orbit

Other: None
IMPRESSION: Enlarged pituitary with mixed signal on T1 and T2. There is probable
subacute hemorrhage within the inferior pituitary with surrounding
necrosis. Findings most compatible with pituitary apoplexy. This may
be pregnancy related or related to underlying pituitary micro
adenoma. Postcontrast imaging of the pituitary recommended. No
compression of the optic chiasm.

ADDENDUM:
These results were called by telephone at the time of interpretation
on [DATE] at [DATE] to provider MOJARDIN , who verbally
acknowledged these results.

*** End of Addendum ***
FINDINGS: Brain: Pituitary enlarged 11 mm. Heterogeneous signal intensity with
a focus of hyperintense T1 and hypointense on T2 signal inferiorly.
The majority of the pituitary is hypointense on T1 and hyperintense
on T2. The large pituitary is touching the undersurface of the optic
chiasm without compression of the chiasm.

Ventricle size normal. Negative for acute infarct, negative for
acute infarct. Small hyperintensity in the left frontal white
matter. Normal brainstem and cerebellum.

Vascular: Normal arterial flow voids.

Skull and upper cervical spine: No focal skeletal lesion.

Sinuses/Orbits: Paranasal sinuses clear.  Negative orbit

Other: None
IMPRESSION: Enlarged pituitary with mixed signal on T1 and T2. There is probable
subacute hemorrhage within the inferior pituitary with surrounding
necrosis. Findings most compatible with pituitary apoplexy. This may
be pregnancy related or related to underlying pituitary micro
adenoma. Postcontrast imaging of the pituitary recommended. No
compression of the optic chiasm.

## 2020-04-02 IMAGING — MR MR HEAD W/ CM
4 of 12 series · 16 of 48 positions shown · IV contrast (gadavist)
Comparison: Noncontrast brain MRI [DATE]

CLINICAL DATA: Headaches. Two weeks postpartum. Enlarged pituitary
gland on noncontrast MRI.

EXAM:
MRI HEAD WITH CONTRAST
TECHNIQUE: Multiplanar, multiecho pulse sequences of the brain and surrounding
structures were obtained with intravenous contrast.
CONTRAST:  3.5mL GADAVIST GADOBUTROL 1 MMOL/ML IV SOLN

[Series 14: T2 post-contrast · coronal · 5.0mm · 0.72mm/px · 3 of 26 slices shown]
[im 4/26]
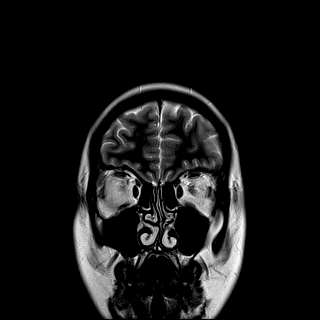
[im 15/26]
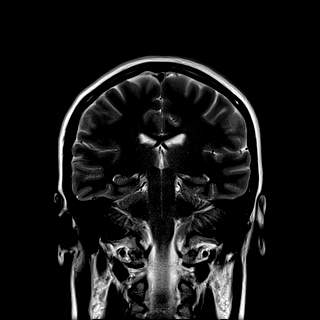
[im 22/26]
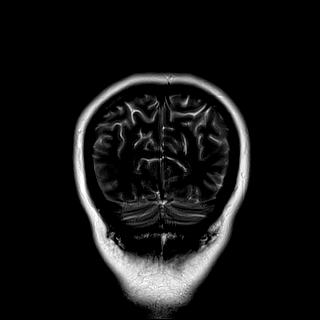

[Series 16: T1 post-contrast · sagittal · 3.0mm · 0.25mm/px · 4 of 12 slices shown (1 of 3)]
[im 1/12]
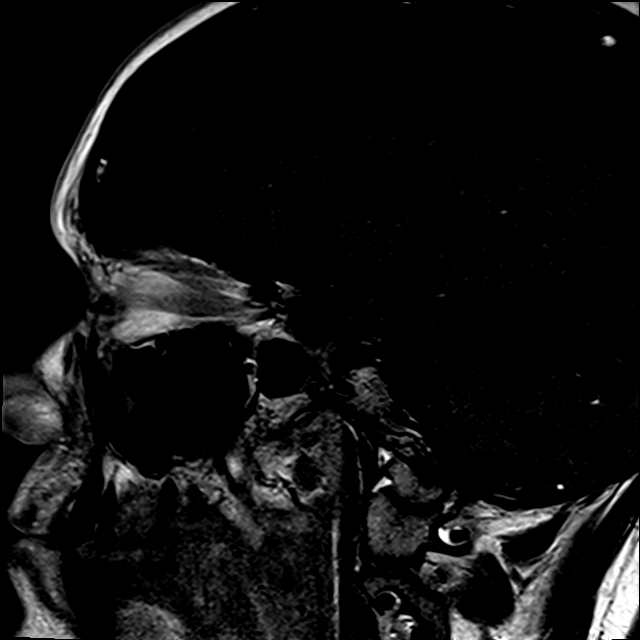
[im 4/12]
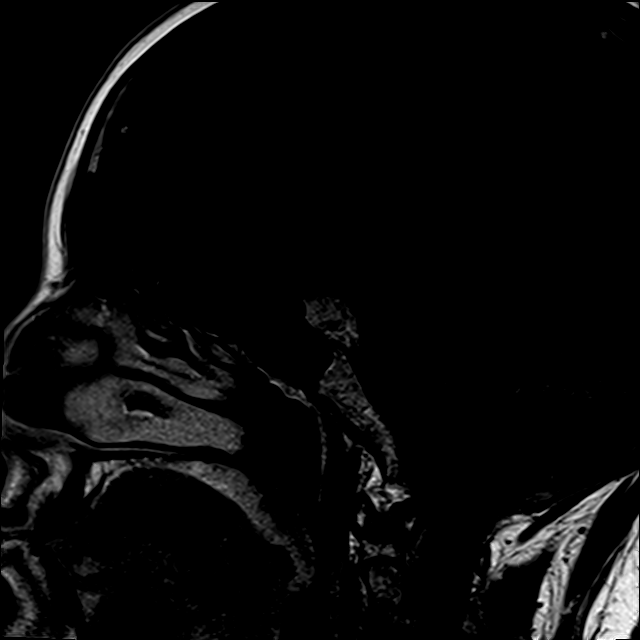
[im 8/12]
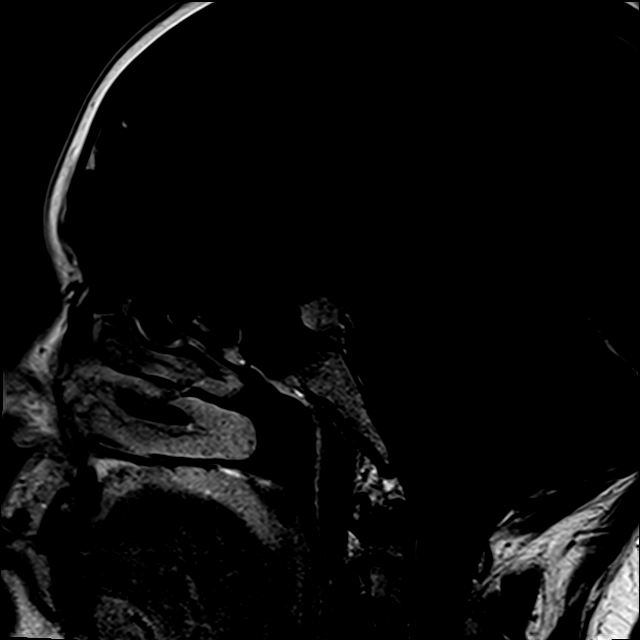
[im 12/12]
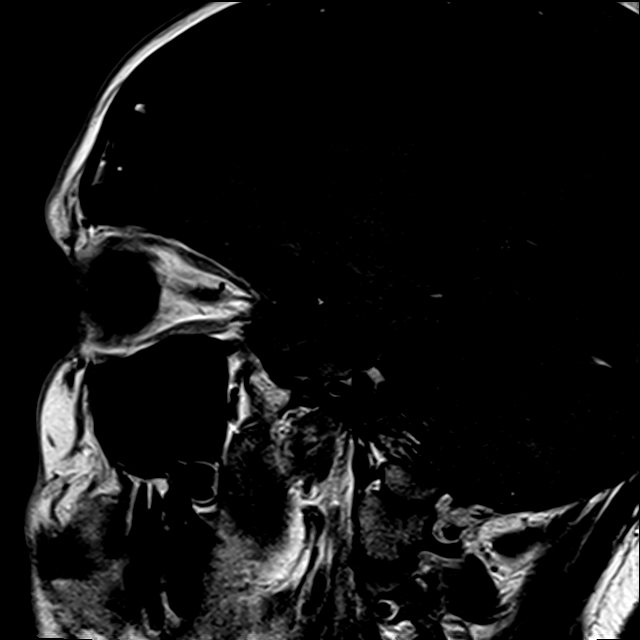

[Series 17: T1 post-contrast · coronal · 3.0mm · 0.25mm/px · 4 of 13 slices shown (2 of 3)]
[im 1/13]
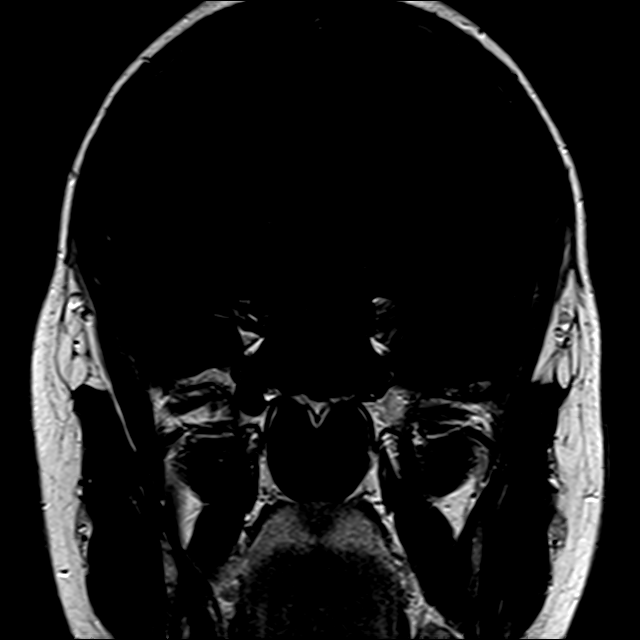
[im 5/13]
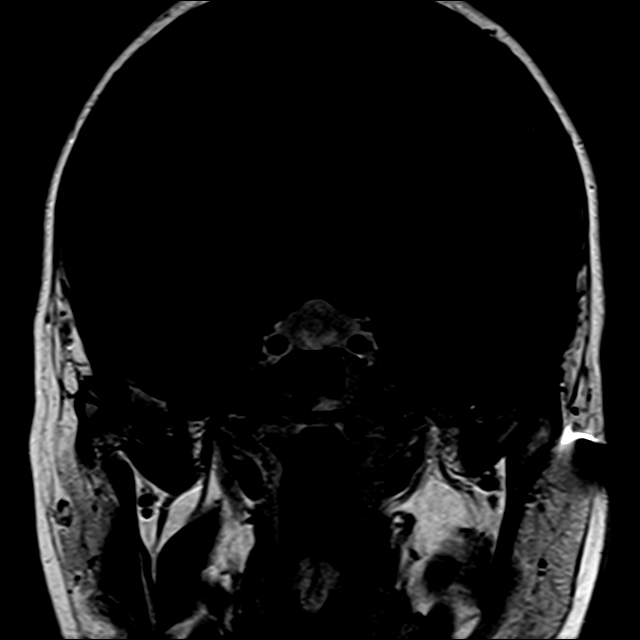
[im 9/13]
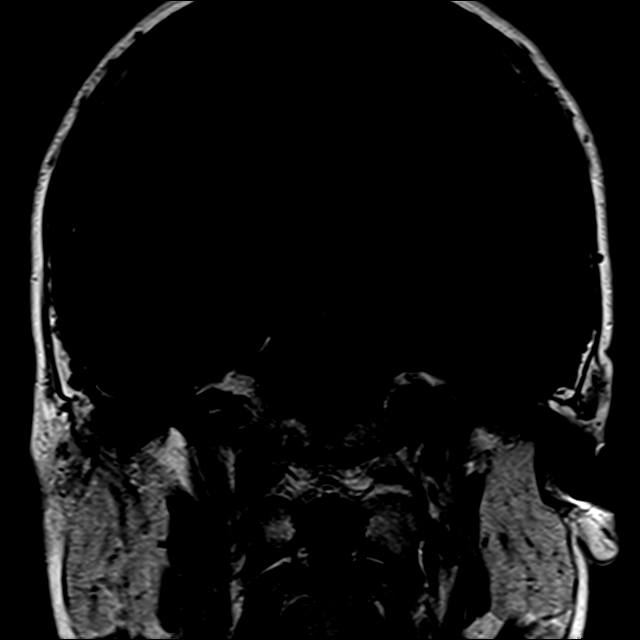
[im 13/13]
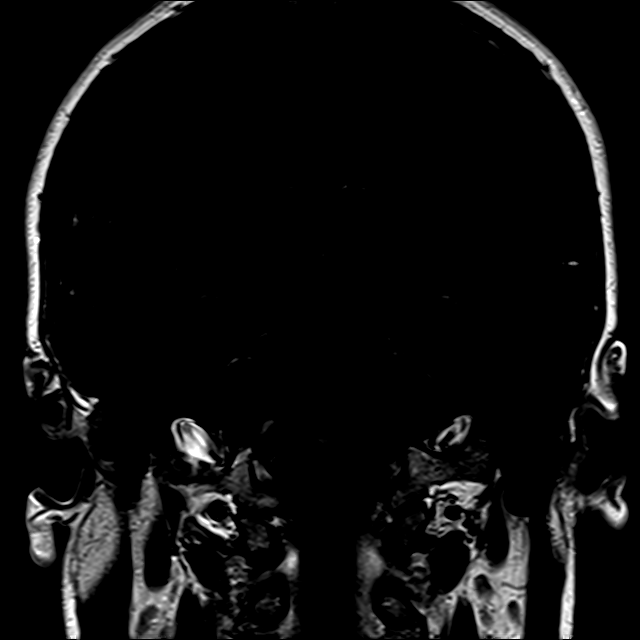

[Series 18: T1 post-contrast · coronal · 5.0mm · 0.34mm/px · 5 of 28 slices shown (3 of 3)]
[im 1/28]
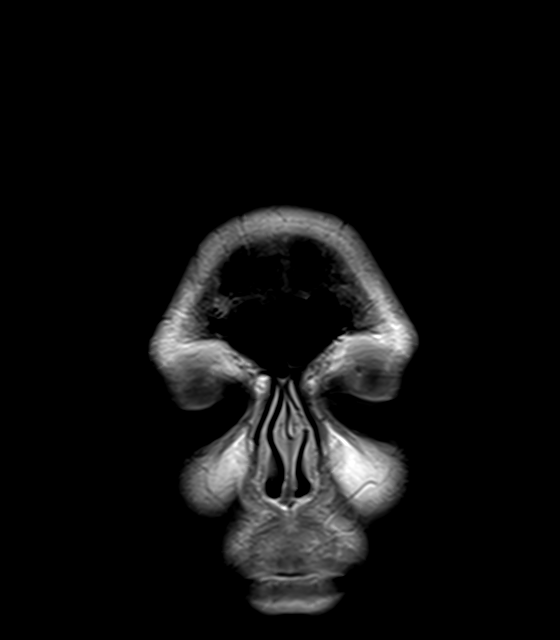
[im 4/28]
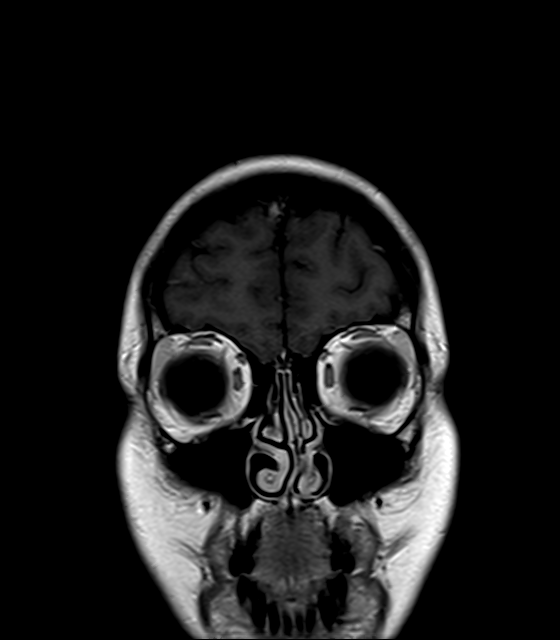
[im 8/28]
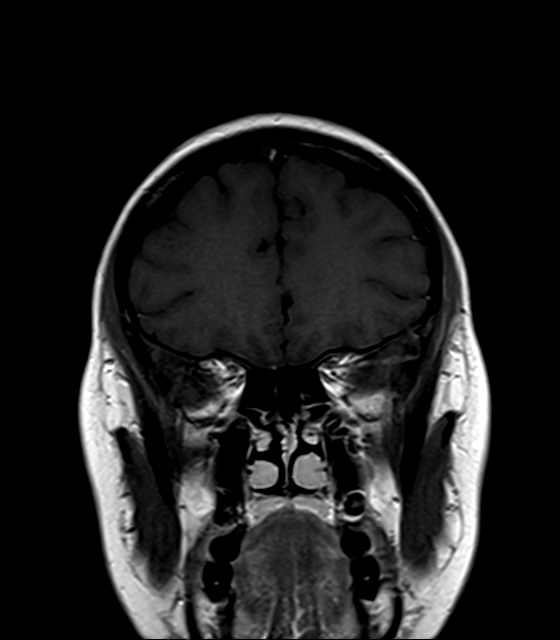
[im 16/28]
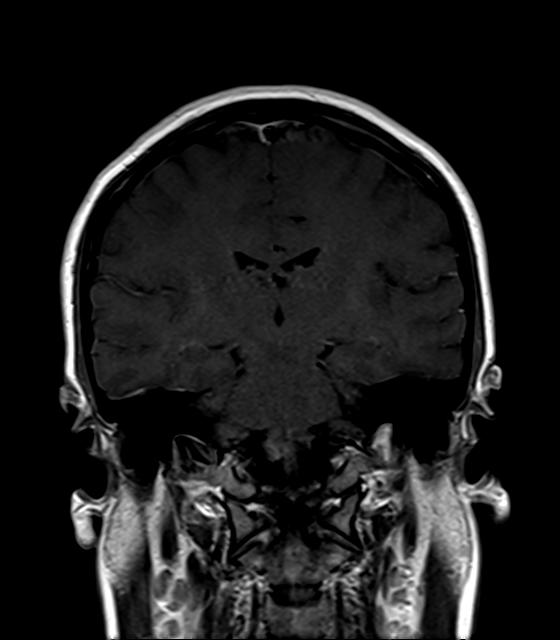
[im 24/28]
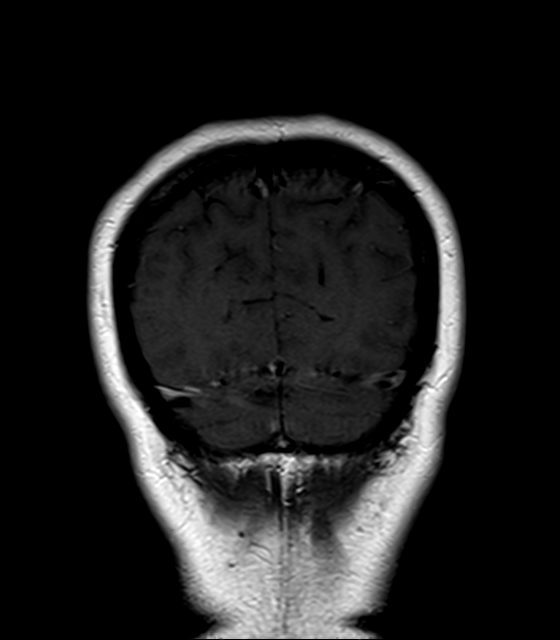

[16 of 48 positions shown; findings below may reference images not displayed]

FINDINGS: As seen on today's earlier MRI, the pituitary gland is enlarged and
measures 11 x 20 x 13 mm (AP x transverse x craniocaudal). T1
hyperintense, T2 hypointense signal in the inferior aspect of the
gland likely reflect subacute hemorrhage. The remainder of the gland
is prominently T2 hyperintense with minimal to no enhancement. The
pituitary extends into the suprasellar cistern and contacts but does
not compress the optic chiasm. No abnormal intracranial enhancement
is identified elsewhere.
IMPRESSION: Enlarged pituitary gland containing blood products with overall
little to no surrounding enhancement. It is possible that this is
related to an underlying adenoma, however a discrete mass is not
readily apparent on these postcontrast images. Imaging follow-up is
recommended as the blood products evolve.

## 2020-04-02 MED ORDER — HYDROMORPHONE HCL 1 MG/ML IJ SOLN: 0.5000 mg | Freq: Once | INTRAMUSCULAR | Status: DC

## 2020-04-02 MED ORDER — OXYCODONE-ACETAMINOPHEN 5-325 MG PO TABS
1.0000 | ORAL_TABLET | Freq: Once | ORAL | Status: AC
  Administered 2020-04-02: 1 via ORAL
  Filled 2020-04-02: qty 1

## 2020-04-02 MED ORDER — GADOBUTROL 1 MMOL/ML IV SOLN
3.5000 mL | Freq: Once | INTRAVENOUS | Status: AC | PRN
  Administered 2020-04-02: 3.5 mL via INTRAVENOUS

## 2020-04-02 MED ORDER — FENTANYL CITRATE (PF) 100 MCG/2ML IJ SOLN
50.0000 ug | Freq: Once | INTRAMUSCULAR | Status: AC
  Administered 2020-04-02: 50 ug via INTRAVENOUS
  Filled 2020-04-02: qty 2

## 2020-04-02 NOTE — Discharge Instructions (Addendum)
You were seen in the emergency department today with abnormal MRI showing some bleeding near the pituitary gland.  You were seen by the neurosurgery team and they do not feel that surgery is required at this time.  Your lab work today is reassuring.  Please follow closely with your OB/GYN as well as your primary care doctor.  They are recommending repeat MRI with pituitary protocol in 6 weeks.  The remaining blood work will come back later tonight or tomorrow and your OB/GYN and primary care doctors can follow-up on this.  If you develop any lightheadedness, passing out, vision changes you should return to the emergency department

## 2020-04-02 NOTE — Consult Note (Signed)
   Providing Compassionate, Quality Care - Together  Neurosurgery Consult  Referring physician: Dr. 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  This has been ongoing since about 1 week after she gave birth to her first child.  She had no difficulty with her pregnancy or delivery, was induced due to hypertension.  Had no significant blood loss or complicated postpartum recovery.  She denies any visual changes, blurry vision or double vision.  She denies any increased urination, states she has had no difficulty with lactation.  Denies any numbness, tingling, weakness whatsoever.  Denies photophobia.  Due to her persistent headaches, her OB ordered her an MRV and MRI and due to the findings an MRI with contrast was also completed.  She has not had any hypotensive episodes in the emergency department.  Medications: I have reviewed the patient's current medications. Allergies: No Known Allergies  History reviewed. No pertinent family history. Social History:  has no history on file for tobacco use, alcohol use, and drug use.  ROS: 14 point review of system was obtained which all pertinent positives and negatives are listed in HPI above  Physical Exam:  Vital signs in last 24 hours: Temp:  [98 F (36.7 C)-98.3 F (36.8 C)] 98 F (36.7 C) (07/25 1814) Pulse Rate:  [58-128] 65 (07/26 0746) Resp:  [11-18] 14 (07/26 0217) BP: (138-182)/(65-125) 153/88 (07/26 0700) SpO2:  [91 %-98 %] 96 % (07/26 0746) PE: AOx3 No acute distress Vital signs stable Cranial nerves II through XII intact Sensory intact light touch EOMI PERRLA Bilateral upper/lower extremity 5/5 strength No drift Visual field full, peripheral fields  full  Imaging: MRV showed no sinus thrombosis.  MRI showed enlarged pituitary gland with subacute blood products inferiorly.  There was no mass-effect noted on the optic chiasm whatsoever.  Contrasted imaging did not reveal any discrete enhancing lesion such as an adenoma however with blood products this could be more challenging to visualize.  Impression/Assessment:  29 year old female with  1.  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  Her labs, vital signs have all been normal. -Tylenol for pain control -If her pituitary panel is within normal limits then she will likely be able to go home with follow-up with repeat MRI of the brain with and without contrast pituitary protocol in 6 weeks. -has followup with OB in 10 days -If she has significant derangements of her pituitary panel, would recommend endocrinology consultation for supplementation    Thank you for allowing me to participate in this patient's care.  Please do not hesitate to call with questions or concerns.   Elwin Sleight, [facility] Cell: [phone]

## 2020-04-02 NOTE — ED Triage Notes (Signed)
Had an MRI this am-- showed bleeding into pituitary galnd, sent here by MD for MRI with contrast-- 3 weeks post partum. Is breast feeding will need to pump soon.  Headache for 2 weeks.

## 2020-04-02 NOTE — ED Provider Notes (Signed)
[place] EMERGENCY DEPARTMENT Provider Note   CSN: [id number] Arrival date & time: 04/02/20  1113     History Chief Complaint  Patient presents with  . sent by dr  . abnormal MRI    Lauren Arroyo is a 29 y.o. female.  Patient has had a headache for 2 weeks.  She is 3 weeks postpartum.  Her OB/GYN got an MRI of the brain without and it shows possible bleeding around the pituitary gland.  Radiology recommended [place] MRI with contrast  The history is provided by the patient. No language interpreter was used.  Headache Pain location:  Generalized Quality:  Dull Radiates to:  Does not radiate Severity currently:  5/10 Severity at highest:  8/10 Onset quality:  Sudden Timing:  Constant Progression:  Worsening Chronicity:  New Similar to prior headaches: no   Context: not activity   Associated symptoms: no abdominal pain, no back pain, no congestion, no cough, no diarrhea, no fatigue, no seizures and no sinus pressure        Past Medical History:  Diagnosis Date  . Family history of breast cancer   . Family history of genetic disease carrier    sister breast cancer gene + (thinks BRCA)  . Family history of pancreatic cancer   . 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 Urticaria     Patient Active Problem List   Diagnosis Date Noted  . Gestational hypertension 03/12/2020  . 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 Genetic testing 05/14/2018  . Family history of breast cancer   . Family history of pancreatic cancer   . Family history of genetic disease carrier   . 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 Other allergic rhinitis 05/29/2016  . Colicky RUQ abdominal pain 12/02/2015  . Gallbladder sludge 12/02/2015  . ADD (attention deficit disorder) 01/22/2015    Past Surgical History:  Procedure Laterality Date  . CHOLECYSTECTOMY       OB History    Gravida  1   Para  1   Term  1   Preterm    0   AB  0   Living  1     SAB  0   TAB  0   Ectopic  0   Multiple      Live Births  1           Family History  Problem Relation Age of Onset  . Allergic rhinitis Father   . Heart block Father   . Other Sister        tested positive for a BRCA gene  . Other Brother        early gray hair  . Breast cancer Maternal Grandmother 24  . Breast cancer Other 75  . Pancreatic cancer Other   . Angioedema Neg Hx   . Asthma Neg Hx   . Eczema Neg Hx   . Immunodeficiency Neg Hx   . Urticaria Neg Hx     Social History   Tobacco Use  . Smoking status: Never Smoker  . Smokeless tobacco: Never Used  Vaping Use  . Vaping Use: Never used  Substance Use Topics  . Alcohol use: Never  . Drug use: Never    Home Medications Prior to Admission medications   Medication Sig Start Date End Date Taking? Authorizing Provider  acetaminophen (TYLENOL) 500 MG tablet Take 1,000 mg by mouth every 12 (twelve) hours.   Yes [provider]  ibuprofen (ADVIL) 200 MG tablet Take 800 mg by mouth every 6 (six) hours as needed (for headaches or mild pain).   Yes [provider]  Multiple Vitamins-Minerals (THERANATAL LACTATION SUPPORT PO) Take 3 tablets by mouth in the morning and at bedtime.   Yes [provider]  Prenatal Vit-Fe Fumarate-FA (PRENATAL MULTIVITAMIN) TABS tablet Take 1 tablet by mouth daily with lunch.    Yes [provider]  fluticasone (FLONASE) 50 MCG/ACT nasal spray Two sprays each nostril once a day if needed for nasal congestion or drainage. Patient not taking: Reported on 04/02/2020 05/29/16   Charlies Silvers, MD  ranitidine (ZANTAC) 150 MG tablet One tablet twice a day as directed. Patient not taking: Reported on 04/02/2020 05/29/16   Charlies Silvers, MD    Allergies    Bactrim [sulfamethoxazole-trimethoprim], Ciprofloxacin, Nitrofurantoin, and Sulfamethoxazole-trimethoprim  Review of Systems   Review of Systems  Constitutional:  Negative for appetite change and fatigue.  HENT: Negative for congestion, ear discharge and sinus pressure.   Eyes: Negative for discharge.  Respiratory: Negative for cough.   Cardiovascular: Negative for chest pain.  Gastrointestinal: Negative for abdominal pain and diarrhea.  Genitourinary: Negative for frequency and hematuria.  Musculoskeletal: Negative for back pain.  Skin: Negative for rash.  Neurological: Positive for headaches. Negative for seizures.  Psychiatric/Behavioral: Negative for hallucinations.    Physical Exam Updated Vital Signs BP 119/78 (BP Location: Right Arm)   Pulse 72   Temp 98.5 F (36.9 C) (Oral)   Resp 16   Ht 5' 2" (1.575 m)   Wt 77.1 kg   LMP 06/16/2019 (Exact Date)   SpO2 100%   BMI 31.09 kg/m   Physical Exam Vitals and nursing note reviewed.  Constitutional:      Appearance: She is well-developed.  HENT:     Head: Normocephalic.     Right Ear: Tympanic membrane normal.     Nose: Nose normal.  Eyes:     General: No scleral icterus.    Conjunctiva/sclera: Conjunctivae normal.  Neck:     Thyroid: No thyromegaly.  Cardiovascular:     Rate and Rhythm: Normal rate and regular rhythm.     Heart sounds: No murmur heard.  No friction rub. No gallop.   Pulmonary:     Breath sounds: No stridor. No wheezing or rales.  Chest:     Chest wall: No tenderness.  Abdominal:     General: There is no distension.     Tenderness: There is no abdominal tenderness. There is no rebound.  Musculoskeletal:        General: Normal range of motion.     Cervical back: Neck supple.  Lymphadenopathy:     Cervical: No cervical adenopathy.  Skin:    Findings: No erythema or rash.  Neurological:     Mental Status: She is alert and oriented to person, place, and time.     Motor: No abnormal muscle tone.     Coordination: Coordination normal.  Psychiatric:        Behavior: Behavior normal.     ED Results / Procedures / Treatments   Labs (all labs ordered  are listed, but only abnormal results are displayed) Labs Reviewed  CBC WITH DIFFERENTIAL/PLATELET  COMPREHENSIVE METABOLIC PANEL  PROLACTIN  GROWTH HORMONE  INSULIN-LIKE GROWTH FACTOR  TSH  T4, FREE  CORTISOL  ACTH  FOLLICLE STIMULATING HORMONE  LUTEINIZING HORMONE  T3    EKG None  Radiology MR BRAIN WO CONTRAST  Addendum Date: 04/02/2020   ADDENDUM REPORT: 04/02/2020 10:17 ADDENDUM: These results were called by telephone at the time of interpretation on 04/02/2020 at 10:16 am to provider [facility] , who verbally acknowledged these results. Electronically Signed   By: Franchot Gallo M.D.   On: 04/02/2020 10:17   Result Date: 04/02/2020 CLINICAL DATA:  Two weeks post partum. Preeclampsia. Headaches since giving birth. EXAM: MRI HEAD WITHOUT CONTRAST TECHNIQUE: Multiplanar, intravenous contrast. COMPARISON:  None. FINDINGS: Brain: Pituitary enlarged 11 mm. Heterogeneous signal intensity with a focus of hyperintense T1 and hypointense on T2 signal inferiorly. The majority of the pituitary is hypointense on T1 and hyperintense on T2. The large pituitary is touching the undersurface of the optic chiasm without compression of the chiasm. Ventricle size normal. Negative for acute infarct, negative for acute infarct. Small hyperintensity in the left frontal white matter. Normal brainstem and cerebellum. Vascular: Normal arterial flow voids. Skull and upper cervical spine: No focal skeletal lesion. Sinuses/Orbits: Paranasal sinuses clear.  Negative orbit Other: None IMPRESSION: Enlarged pituitary with mixed signal on T1 and T2. There is probable subacute hemorrhage within the inferior pituitary with surrounding necrosis. Findings most compatible with pituitary apoplexy. This may be pregnancy related or related to underlying pituitary micro adenoma. Postcontrast imaging of the pituitary recommended. No compression of the optic chiasm. Electronically Signed: By: Franchot Gallo M.D. On:  04/02/2020 10:13   MR BRAIN W CONTRAST  Result Date: 04/02/2020 CLINICAL DATA:  Headaches. Two weeks postpartum. Enlarged pituitary gland on noncontrast MRI. EXAM: MRI HEAD WITH CONTRAST TECHNIQUE: Multiplanar, multiecho pulse sequences of the brain and surrounding structures were obtained with intravenous contrast. CONTRAST:  3.37m GADAVIST GADOBUTROL 1 MMOL/ML IV SOLN COMPARISON:  Noncontrast brain MRI 04/02/2020 FINDINGS: As seen on today's earlier MRI, the pituitary gland is enlarged and measures 11 x 20 x 13 mm (AP x transverse x craniocaudal). T1 hyperintense, T2 hypointense signal in the inferior aspect of the gland likely reflect subacute hemorrhage. The remainder of the gland is prominently T2 hyperintense with minimal to no enhancement. The pituitary extends into the suprasellar cistern and contacts but does not compress the optic chiasm. No abnormal intracranial enhancement is identified elsewhere. IMPRESSION: Enlarged pituitary gland containing blood products with overall little to no surrounding enhancement. It is possible that this is related to an underlying adenoma, however a discrete mass is not readily apparent on these postcontrast images. Imaging follow-up is recommended as the blood products evolve. Electronically Signed   By: ALogan BoresM.D.   On: 04/02/2020 14:35   MR MRV HEAD WO CM  Result Date: 04/02/2020 CLINICAL DATA:  Headaches.  Two weeks post partum. EXAM: MR VENOGRAM OF THE HEAD WITHOUT CONTRAST TECHNIQUE: Angiographic images of the intracranial venous structures were obtained using MRV technique without intravenous contrast. COMPARISON:  MRI head 04/02/2020 FINDINGS: Dural venous sinuses are patent bilaterally. No occlusion or venous sinus thrombosis. Superior sagittal sinus widely patent. Transverse and sigmoid sinus patent bilaterally. Straight sinus patent. IMPRESSION: Normal dural venous sinuses. Electronically Signed   By: CFranchot GalloM.D.   On: 04/02/2020 09:59     Procedures Procedures (including critical care time)  Medications Ordered in ED Medications  gadobutrol (GADAVIST) 1 MMOL/ML injection 3.5 mL (3.5 mLs Intravenous Contrast Given 04/02/20 1310)  fentaNYL (SUBLIMAZE) injection 50 mcg (50 mcg Intravenous Given 04/02/20 1351)  oxyCODONE-acetaminophen (PERCOCET/ROXICET) 5-325 MG per tablet 1 tablet (1 tablet Oral Given 04/02/20 1548)    ED Course  I have reviewed the triage vital  signs and the nursing notes.  Pertinent labs & imaging results that were available during my care of the patient were reviewed by me and considered in my medical decision making (see chart for details).    MDM Rules/Calculators/A&P                          Patient with enlarged pituitary gland with possible bleeding.  Neurosurgery to see patient and make disposition Final Clinical Impression(s) / ED Diagnoses Final diagnoses:  None    Rx / DC Orders ED Discharge Orders    None       Milton Ferguson, MD 04/05/20 [phone]

## 2020-04-02 NOTE — ED Notes (Signed)
Pt transported to MRI 

## 2020-04-02 NOTE — ED Provider Notes (Signed)
Blood pressure 109/73, pulse 77, temperature 98.5 F (36.9 C), temperature source Oral, resp. rate 16, height 5\' 2"  (1.575 m), weight 77.1 kg, last menstrual period 06/16/2019, SpO2 100 %, unknown if currently breastfeeding.  Assuming care from Dr. Roderic Palau.  In short, Lauren Arroyo is a 29 y.o. female with a chief complaint of sent by dr and abnormal MRI .  Refer to the original H&P for additional details.  The current plan of care is to f/u on NSG recommendations.  04:50 PM  Spoke with NSG after their evaluation of the patient. She is doing well clinically. Hormone levels sent. Will wait on Cortisol and if abnormal discuss with Endocrinology. Can be discharged home if negative.   PM Cortisol level WNL. Patient continues to feel well. No NSG intervention. Recommendation is for repeat MRI in 6 weeks and OB follow up which is scheduled. Discussed ED return precautions and provided in writing as well. Patient is comfortable with the plan at discharge.    Margette Fast, MD 04/02/20 [phone]

## 2020-04-03 LAB — T3: T3, Total: 125 ng/dL (ref 71–180)

## 2020-04-03 LAB — ACTH: C206 ACTH: 3.7 pg/mL — ABNORMAL LOW (ref 7.2–63.3)

## 2020-04-03 LAB — LUTEINIZING HORMONE: LH: 2 m[IU]/mL

## 2020-04-03 LAB — INSULIN-LIKE GROWTH FACTOR: Somatomedin C: 204 ng/mL (ref 91–308)

## 2020-04-03 LAB — GROWTH HORMONE: Growth Hormone: 0.1 ng/mL (ref 0.0–10.0)

## 2020-04-03 LAB — PROLACTIN: Prolactin: 54.9 ng/mL — ABNORMAL HIGH (ref 4.8–23.3)

## 2020-04-03 LAB — FOLLICLE STIMULATING HORMONE: FSH: 5.7 m[IU]/mL

## 2020-04-05 DIAGNOSIS — E236 Other disorders of pituitary gland: Secondary | ICD-10-CM | POA: Insufficient documentation

## 2020-05-18 DIAGNOSIS — E236 Other disorders of pituitary gland: Secondary | ICD-10-CM | POA: Diagnosis not present

## 2020-05-20 DIAGNOSIS — R030 Elevated blood-pressure reading, without diagnosis of hypertension: Secondary | ICD-10-CM | POA: Insufficient documentation

## 2020-05-20 DIAGNOSIS — Z6832 Body mass index (BMI) 32.0-32.9, adult: Secondary | ICD-10-CM | POA: Diagnosis not present

## 2020-05-20 DIAGNOSIS — E236 Other disorders of pituitary gland: Secondary | ICD-10-CM | POA: Diagnosis not present

## 2020-07-06 ENCOUNTER — Other Ambulatory Visit: Payer: Self-pay

## 2020-07-06 ENCOUNTER — Ambulatory Visit: Payer: 59 | Admitting: Podiatry

## 2020-07-06 ENCOUNTER — Telehealth: Payer: Self-pay

## 2020-07-06 DIAGNOSIS — L600 Ingrowing nail: Secondary | ICD-10-CM

## 2020-07-06 DIAGNOSIS — M79674 Pain in right toe(s): Secondary | ICD-10-CM | POA: Diagnosis not present

## 2020-07-06 DIAGNOSIS — L603 Nail dystrophy: Secondary | ICD-10-CM | POA: Diagnosis not present

## 2020-07-06 DIAGNOSIS — M79675 Pain in left toe(s): Secondary | ICD-10-CM | POA: Diagnosis not present

## 2020-07-06 MED ORDER — CEPHALEXIN 500 MG PO CAPS: 500.0000 mg | ORAL_CAPSULE | Freq: Three times a day (TID) | ORAL | 0 refills | Status: DC

## 2020-07-06 NOTE — Telephone Encounter (Signed)
Toenail sample mailed to [facility] for fungal culture

## 2020-07-06 NOTE — Patient Instructions (Signed)
Place 1/4 cup of epsom salts in a quart of warm tap water.  Submerge your foot or feet in the solution and soak for 20 minutes.  This soak should be done twice a day.  Next, remove your foot or feet from solution, blot dry the affected area. Apply ointment and cover if instructed by your doctor.   IF YOUR SKIN BECOMES IRRITATED WHILE USING THESE INSTRUCTIONS, IT IS OKAY TO SWITCH TO  WHITE VINEGAR AND WATER.  As another alternative soak, you may use antibacterial soap and water.  Monitor for any signs/symptoms of infection. Call the office immediately if any occur or go directly to the emergency room. Call with any questions/concerns.  Ingrown Toenail An ingrown toenail occurs when the corner or sides of a toenail grow into the surrounding skin. This causes discomfort and pain. The big toe is most commonly affected, but any of the toes can be affected. If an ingrown toenail is not treated, it can become infected. What are the causes? This condition may be caused by:  Wearing shoes that are too small or tight.  An injury, such as stubbing your toe or having your toe stepped on.  Improper cutting or care of your toenails.  Having nail or foot abnormalities that were present from birth (congenital abnormalities), such as having a nail that is too big for your toe. What increases the risk? The following factors may make you more likely to develop ingrown toenails:  Age. Nails tend to get thicker with age, so ingrown nails are more common among older people.  Cutting your toenails incorrectly, such as cutting them very short or cutting them unevenly. An ingrown toenail is more likely to get infected if you have:  Diabetes.  Blood flow (circulation) problems. What are the signs or symptoms? Symptoms of an ingrown toenail may include:  Pain, soreness, or tenderness.  Redness.  Swelling.  Hardening of the skin that surrounds the toenail. Signs that an ingrown toenail may be infected  include:  Fluid or pus.  Symptoms that get worse instead of better. How is this diagnosed? An ingrown toenail may be diagnosed based on your medical history, your symptoms, and a physical exam. If you have fluid or blood coming from your toenail, a sample may be collected to test for the specific type of bacteria that is causing the infection. How is this treated? Treatment depends on how severe your ingrown toenail is. You may be able to care for your toenail at home.  If you have an infection, you may be prescribed antibiotic medicines.  If you have fluid or pus draining from your toenail, your health care provider may drain it.  If you have trouble walking, you may be given crutches to use.  If you have a severe or infected ingrown toenail, you may need a procedure to remove part or all of the nail. Follow these instructions at home: Foot care  Do not pick at your toenail or try to remove it yourself.  Soak your foot in warm, soapy water. Do this for 20 minutes, 3 times a day, or as often as told by your health care provider. This helps to keep your toe clean and keep your skin soft.  Wear shoes that fit well and are not too tight. Your health care provider may recommend that you wear open-toed shoes while you heal.  Trim your toenails regularly and carefully. Cut your toenails straight across to prevent injury to the skin at the corners   of the toenail. Do not cut your nails in a curved shape.  Keep your feet clean and dry to help prevent infection.   Medicines  Take over-the-counter and prescription medicines only as told by your health care provider.  If you were prescribed an antibiotic, take it as told by your health care provider. Do not stop taking the antibiotic even if you start to feel better. Activity  Return to your normal activities as told by your health care provider. Ask your health care provider what activities are safe for you.  Avoid activities that cause  pain. General instructions  If your health care provider told you to use crutches to help you move around, use them as instructed.  Keep all follow-up visits as told by your health care provider. This is important. Contact a health care provider if:  You have more redness, swelling, pain, or other symptoms that do not improve with treatment.  You have fluid, blood, or pus coming from your toenail. Get help right away if:  You have a red streak on your skin that starts at your foot and spreads up your leg.  You have a fever. Summary  An ingrown toenail occurs when the corner or sides of a toenail grow into the surrounding skin. This causes discomfort and pain. The big toe is most commonly affected, but any of the toes can be affected.  If an ingrown toenail is not treated, it can become infected.  Fluid or pus draining from your toenail is a sign of infection. Your health care provider may need to drain it. You may be given antibiotics to treat the infection.  Trimming your toenails regularly and properly can help you prevent an ingrown toenail. This information is not intended to replace advice given to you by your health care provider. Make sure you discuss any questions you have with your health care provider. Document Revised: 08/23/2018 Document Reviewed: 01/17/2017 Elsevier Patient Education  2021 [employer]  

## 2020-07-10 NOTE — Progress Notes (Signed)
Subjective:   Patient ID: Lauren Arroyo, female   DOB: 30 y.o.   MRN: [id number]   HPI 30 year old female presents the office today for concerns of ingrown toenails of both of her big toes on both corners.  She states that after she was pregnant her feet were swollen she try to wear shoes that were too tight which started the symptoms.  Since then they have become tender with pressure.  No drainage or pus but she had swelling and some redness on the nail borders.   Review of Systems  All other systems reviewed and are negative.  Past Medical History:  Diagnosis Date  . Family history of breast cancer   . Family history of genetic disease carrier    sister breast cancer gene + (thinks BRCA)  . Family history of pancreatic cancer   . 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 Urticaria     Past Surgical History:  Procedure Laterality Date  . CHOLECYSTECTOMY       Current Outpatient Medications:  .  cephALEXin (KEFLEX) 500 MG capsule, Take 1 capsule (500 mg total) by mouth 3 (three) times daily., Disp: 21 capsule, Rfl: 0 .  acetaminophen (TYLENOL) 500 MG tablet, Take 1,000 mg by mouth every 12 (twelve) hours., Disp: , Rfl:  .  fluticasone (FLONASE) 50 MCG/ACT nasal spray, Two sprays each nostril once a day if needed for nasal congestion or drainage. (Patient not taking: Reported on 04/02/2020), Disp: 16 g, Rfl: 5 .  Prenatal Vit-Fe Fumarate-FA (PRENATAL MULTIVITAMIN) TABS tablet, Take 1 tablet by mouth daily with lunch. , Disp: , Rfl:  .  ranitidine (ZANTAC) 150 MG tablet, One tablet twice a day as directed. (Patient not taking: Reported on 04/02/2020), Disp: 60 tablet, Rfl: 5  Allergies  Allergen Reactions  . Bactrim [Sulfamethoxazole-Trimethoprim] Shortness Of Breath, Nausea And Vomiting and Rash  . Ciprofloxacin Shortness Of Breath, Nausea And Vomiting and Rash  . Nitrofurantoin Shortness Of Breath, Nausea And Vomiting and Rash  .  Sulfamethoxazole-Trimethoprim Shortness Of Breath, Nausea And Vomiting and Rash  . Sulfamethoxazole   . Trimethoprim         Objective:  Physical Exam  General: AAO x3, NAD  Dermatological: Incurvation present to both the medial lateral aspects of bilateral hallux toenails with localized edema and erythema.  No drainage or pus or ascending cellulitis.  No open lesions.  Nails appear to be hypertrophic, dystrophic with yellow-brown discoloration.  Vascular: Dorsalis Pedis artery and Posterior Tibial artery pedal pulses are 2/4 bilateral with immedate capillary fill time. There is no pain with calf compression, swelling, warmth, erythema.   Neruologic: Grossly intact via light touch bilateral.    Musculoskeletal: Tenderness palpation to the nail borders both medial lateral aspects of bilateral hallux.  Muscular strength 5/5 in all groups tested bilateral.  Gait: Unassisted, Nonantalgic.       Assessment:   30 year old female ingrown toenail bilateral hallux; onychomycosis     Plan:  -Treatment options discussed including all alternatives, risks, and complications -Etiology of symptoms were discussed -At this time, the patient is requesting partial nail removal with chemical matricectomy to the symptomatic portion of the nail. Risks and complications were discussed with the patient for which they understand and written consent was obtained. Under sterile conditions a total of 3 mL of a mixture of 2% lidocaine plain and 0.5% Marcaine plain was infiltrated in a hallux block fashion. Once anesthetized, the skin was prepped in sterile fashion.  A tourniquet was then applied. Next the medial and lateral aspect of hallux nail border was then sharply excised making sure to remove the entire offending nail border. Once the nails were ensured to be removed area was debrided and the underlying skin was intact. There is no purulence identified in the procedure. Next phenol was then applied under  standard conditions and copiously irrigated.  Antibiotic ointment was applied. A dry sterile dressing was applied. After application of the dressing the tourniquet was removed and there is found to be an immediate capillary refill time to the digit. The patient tolerated the procedure well any complications. Post procedure instructions were discussed the patient for which he verbally understood. Follow-up in one week for nail check or sooner if any problems are to arise. Discussed signs/symptoms of infection and directed to call the office immediately should any occur or go directly to the emergency room. In the meantime, encouraged to call the office with any questions, concerns, changes symptoms. -Keflex  -Nails sent for culture  Trula Slade DPM

## 2020-07-20 ENCOUNTER — Ambulatory Visit (INDEPENDENT_AMBULATORY_CARE_PROVIDER_SITE_OTHER): Payer: 59 | Admitting: Podiatry

## 2020-07-20 ENCOUNTER — Other Ambulatory Visit: Payer: Self-pay

## 2020-07-20 DIAGNOSIS — L603 Nail dystrophy: Secondary | ICD-10-CM | POA: Diagnosis not present

## 2020-07-20 DIAGNOSIS — L600 Ingrowing nail: Secondary | ICD-10-CM | POA: Diagnosis not present

## 2020-07-25 NOTE — Progress Notes (Signed)
Subjective: Lauren Arroyo is a 30 y.o.  female returns to office today for follow up evaluation after having hallux partial nail avulsion performed. Patient has been soaking using epsom saslts and applying topical antibiotic covered with bandaid daily.  Also presents to discuss no culture results.  Patient denies fevers, chills, nausea, vomiting. Denies any calf pain, chest pain, SOB.   Objective:  General: Well developed, nourished, in no acute distress, alert and oriented x3   Dermatology: Skin is warm, dry and supple bilateral. Hallux nail border appears to be clean, dry, with mild granular tissue and surrounding scab. There is no surrounding erythema, edema, drainage/purulence. The remaining nails appear unremarkable at this time. There are no other lesions or other signs of infection present.  Neurovascular status: Intact. No lower extremity swelling; No pain with calf compression bilateral.  Musculoskeletal: No significant tenderness to palpation of the hallux nail folds. Muscular strength within normal limits bilateral.   Assesement and Plan: S/p partial nail avulsion, doing well; onychodystrophy  -Continue soaking in epsom salts twice a day followed by antibiotic ointment and a band-aid. Can leave uncovered at night. Continue this until completely healed.  -If the area has not healed in 2 weeks, call the office for follow-up appointment, or sooner if any problems arise.  -Discussed nail culture results which are negative for nail fungus.  We discussed biotin supplement and also she can use urea nail gel once the wounds have healed from the procedure.  -Monitor for any signs/symptoms of infection. Call the office immediately if any occur or go directly to the emergency room. Call with any questions/concerns.  Celesta Gentile, DPM

## 2021-02-03 ENCOUNTER — Other Ambulatory Visit: Payer: Self-pay

## 2021-02-03 ENCOUNTER — Emergency Department
Admission: RE | Admit: 2021-02-03 | Discharge: 2021-02-03 | Disposition: A | Discharge status: Home / Self Care | Payer: 59 | Source: Ambulatory Visit | Attending: Family Medicine | Admitting: Family Medicine

## 2021-02-03 VITALS — BP 115/83 | HR 110 | Temp 99.3°F | Resp 18

## 2021-02-03 DIAGNOSIS — R6889 Other general symptoms and signs: Secondary | ICD-10-CM | POA: Diagnosis not present

## 2021-02-03 NOTE — Discharge Instructions (Addendum)
Drink lots of fluids Get lots of rest Take Advil, Aleve, or Tylenol as needed for pain and fever May use over-the-counter cough and cold preparations  Your test will be available in MyChart.  We have tested for COVID, influenza A and B, and RSV

## 2021-02-03 NOTE — ED Provider Notes (Signed)
[facility]    CSN: [id number] Arrival date & time: 02/03/21  1216      History   Chief Complaint Chief Complaint  Patient presents with   Cough   Nasal Congestion    HPI Lauren Arroyo is a 30 y.o. female.   HPI  Patient states her 68-monthold son is sick with a virus, runny nose, cough and congestion.  She took him to the pediatrician who states he did not have COVID or RSV.  She now has runny nose cough congestion and fever to 101.7.  Her home COVID tests are negative x2.  She is COVID vaccinated.  She also has diarrhea.  No known exposure to illness except for her child.  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   c.2540dup (p.[id number]   Urticaria     Patient Active Problem List   Diagnosis Date Noted   Body mass index (BMI) 32.0-32.9, adult 05/20/2020   Elevated blood-pressure reading, without diagnosis of hypertension 05/20/2020   [place] of [place] gland [place] 05/18/2020   [place] 04/05/2020   Gestational hypertension 153/00/5110  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   Genetic testing 05/14/2018   Family history of breast cancer    Family history of pancreatic cancer    Family history of genetic disease carrier    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 Authorizing Provider  acetaminophen (TYLENOL) 500 MG tablet Take 1,000 mg by mouth every 12 (twelve) hours.    [provider]  Prenatal Vit-Fe Fumarate-FA (PRENATAL MULTIVITAMIN) TABS tablet Take 1 tablet by mouth daily with lunch.     [provider]  ranitidine (ZANTAC) 150 MG tablet One tablet twice a day as directed. Patient not taking: Reported on 04/02/2020 05/29/16   BCharlies Silvers MD    Family History Family History  Problem Relation Age of Onset   Allergic rhinitis Father    Heart block Father    Other Sister        tested positive for a BRCA gene   Other Brother        early gray hair   Breast cancer Maternal Grandmother 350  Breast cancer Other 768  Pancreatic cancer Other    Angioedema Neg Hx    Asthma Neg Hx    Eczema Neg Hx    Immunodeficiency Neg Hx    Urticaria Neg Hx     Social History  Social History   Tobacco Use   Smoking status: Never   Smokeless tobacco: Never  Vaping Use   Vaping Use: Never used  Substance Use Topics   Alcohol use: Never   Drug use: Never     Allergies   Bactrim [sulfamethoxazole-trimethoprim], Ciprofloxacin, Nitrofurantoin, Sulfamethoxazole-trimethoprim, Sulfamethoxazole, and Trimethoprim   Review of Systems Review of Systems See HPI  Physical Exam Triage Vital Signs ED Triage Vitals  Enc Vitals Group     BP 02/03/21 1241 115/83     Pulse Rate 02/03/21 1241 (!) 110     Resp 02/03/21 1241 18     Temp 02/03/21 1241 99.3 F (37.4 C)     Temp Source 02/03/21 1241 Oral     SpO2 02/03/21 1241 99 %     Weight --      Height --      Head Circumference --      Peak Flow --      Pain Score 02/03/21 1242 0     Pain Loc --      Pain Edu? --      Excl. in [place]? --    No data found.  Updated Vital Signs BP 115/83 (BP Location: Right Arm)   Pulse (!) 110   Temp 99.3 F (37.4 C) (Oral)   Resp 18   LMP  01/29/2021 (Approximate)   SpO2 99%      Physical Exam Constitutional:      General: She is not in acute distress.    Appearance: She is well-developed and normal weight. She is ill-appearing.  HENT:     Head: Normocephalic and atraumatic.     Right Ear: Tympanic membrane, ear canal and external ear normal.     Left Ear: Tympanic membrane, ear canal and external ear normal.     Nose: Congestion and rhinorrhea present.     Comments: Moderate congestion.  Red membranes in the nasal passages.  Thick white discharge    Mouth/Throat:     Mouth: Mucous membranes are moist.     Pharynx: No posterior oropharyngeal erythema.  Eyes:     Conjunctiva/sclera: Conjunctivae normal.     Pupils: Pupils are equal, round, and reactive to light.  Cardiovascular:     Rate and Rhythm: Normal rate and regular rhythm.     Heart sounds: Normal heart sounds.  Pulmonary:     Effort: Pulmonary effort is normal. No respiratory distress.     Breath sounds: Normal breath sounds. No wheezing or rales.  Abdominal:     General: There is no distension.     Palpations: Abdomen is soft.  Musculoskeletal:        General: Normal range of motion.     Cervical back: Normal range of motion and neck supple.  Lymphadenopathy:     Cervical: No cervical adenopathy.  Skin:    General: Skin is warm and dry.  Neurological:     Mental Status: She is alert.     UC Treatments / Results  Labs (all labs ordered are listed, but only abnormal results are displayed) Labs Reviewed  COVID-19, FLU A+B AND RSV    EKG   Radiology No results found.  Procedures Procedures (including critical care time)  Medications Ordered in UC Medications - No data to display  Initial Impression / Assessment and Plan / UC Course  I have reviewed the triage vital signs and the nursing notes.  Pertinent labs & imaging results that were available during my care of  the patient were reviewed by me and considered in my medical decision  making (see chart for details).     Patient likely has viral illness.  Viral swab was performed.  Home to rest with over-the-counter symptomatic care.  Return as needed Final Clinical Impressions(s) / UC Diagnoses   Final diagnoses:  Flu-like symptoms     Discharge Instructions      Drink lots of fluids Get lots of rest Take Advil, Aleve, or Tylenol as needed for pain and fever May use over-the-counter cough and cold preparations  Your test will be available in MyChart.  We have tested for COVID, influenza A and B, and RSV   ED Prescriptions   None    PDMP not reviewed this encounter.   Raylene Everts, MD 02/03/21 1409

## 2021-02-03 NOTE — ED Triage Notes (Signed)
Pt c/o cough, congestion and fever since Tuesday. 101.7 fever earlier today, tylenol at 1130am. COVID at home neg this am. Claritin also prn.

## 2021-02-04 LAB — COVID-19, FLU A+B AND RSV
Influenza A, NAA: NOT DETECTED
Influenza B, NAA: NOT DETECTED
RSV, NAA: NOT DETECTED
SARS-CoV-2, NAA: DETECTED — AB

## 2021-04-13 DIAGNOSIS — Z01419 Encounter for gynecological examination (general) (routine) without abnormal findings: Secondary | ICD-10-CM | POA: Diagnosis not present

## 2021-04-13 DIAGNOSIS — Z124 Encounter for screening for malignant neoplasm of cervix: Secondary | ICD-10-CM | POA: Diagnosis not present

## 2021-05-11 DIAGNOSIS — G8911 Acute pain due to trauma: Secondary | ICD-10-CM | POA: Diagnosis not present

## 2021-05-11 DIAGNOSIS — M25571 Pain in right ankle and joints of right foot: Secondary | ICD-10-CM | POA: Diagnosis not present

## 2021-05-11 DIAGNOSIS — S93401A Sprain of unspecified ligament of right ankle, initial encounter: Secondary | ICD-10-CM | POA: Diagnosis not present

## 2021-06-02 DIAGNOSIS — Z131 Encounter for screening for diabetes mellitus: Secondary | ICD-10-CM | POA: Diagnosis not present

## 2021-06-02 DIAGNOSIS — G4700 Insomnia, unspecified: Secondary | ICD-10-CM | POA: Diagnosis not present

## 2021-06-02 DIAGNOSIS — Z1329 Encounter for screening for other suspected endocrine disorder: Secondary | ICD-10-CM | POA: Diagnosis not present

## 2021-06-02 DIAGNOSIS — E538 Deficiency of other specified B group vitamins: Secondary | ICD-10-CM | POA: Diagnosis not present

## 2021-06-02 DIAGNOSIS — R5383 Other fatigue: Secondary | ICD-10-CM | POA: Diagnosis not present

## 2021-06-02 DIAGNOSIS — R635 Abnormal weight gain: Secondary | ICD-10-CM | POA: Diagnosis not present

## 2021-06-02 DIAGNOSIS — R519 Headache, unspecified: Secondary | ICD-10-CM | POA: Diagnosis not present

## 2021-06-02 DIAGNOSIS — E559 Vitamin D deficiency, unspecified: Secondary | ICD-10-CM | POA: Diagnosis not present

## 2021-06-02 DIAGNOSIS — R69 Illness, unspecified: Secondary | ICD-10-CM | POA: Diagnosis not present

## 2021-06-08 DIAGNOSIS — R69 Illness, unspecified: Secondary | ICD-10-CM | POA: Diagnosis not present

## 2021-06-08 DIAGNOSIS — Z1339 Encounter for screening examination for other mental health and behavioral disorders: Secondary | ICD-10-CM | POA: Diagnosis not present

## 2021-07-07 DIAGNOSIS — R635 Abnormal weight gain: Secondary | ICD-10-CM | POA: Diagnosis not present

## 2021-07-07 DIAGNOSIS — E559 Vitamin D deficiency, unspecified: Secondary | ICD-10-CM | POA: Diagnosis not present

## 2021-07-07 DIAGNOSIS — D352 Benign neoplasm of pituitary gland: Secondary | ICD-10-CM | POA: Diagnosis not present

## 2021-07-07 DIAGNOSIS — E538 Deficiency of other specified B group vitamins: Secondary | ICD-10-CM | POA: Diagnosis not present

## 2021-07-07 DIAGNOSIS — R519 Headache, unspecified: Secondary | ICD-10-CM | POA: Diagnosis not present

## 2021-07-07 DIAGNOSIS — R69 Illness, unspecified: Secondary | ICD-10-CM | POA: Diagnosis not present

## 2021-07-07 DIAGNOSIS — R5383 Other fatigue: Secondary | ICD-10-CM | POA: Diagnosis not present

## 2021-07-07 DIAGNOSIS — G4700 Insomnia, unspecified: Secondary | ICD-10-CM | POA: Diagnosis not present

## 2021-08-17 DIAGNOSIS — R69 Illness, unspecified: Secondary | ICD-10-CM | POA: Diagnosis not present

## 2021-08-17 DIAGNOSIS — G4700 Insomnia, unspecified: Secondary | ICD-10-CM | POA: Diagnosis not present

## 2021-08-17 DIAGNOSIS — D352 Benign neoplasm of pituitary gland: Secondary | ICD-10-CM | POA: Diagnosis not present

## 2021-08-17 DIAGNOSIS — E559 Vitamin D deficiency, unspecified: Secondary | ICD-10-CM | POA: Diagnosis not present

## 2021-08-17 DIAGNOSIS — R635 Abnormal weight gain: Secondary | ICD-10-CM | POA: Diagnosis not present

## 2021-08-17 DIAGNOSIS — E538 Deficiency of other specified B group vitamins: Secondary | ICD-10-CM | POA: Diagnosis not present

## 2021-08-17 DIAGNOSIS — R519 Headache, unspecified: Secondary | ICD-10-CM | POA: Diagnosis not present

## 2021-08-17 DIAGNOSIS — R5383 Other fatigue: Secondary | ICD-10-CM | POA: Diagnosis not present

## 2021-10-06 DIAGNOSIS — E559 Vitamin D deficiency, unspecified: Secondary | ICD-10-CM | POA: Diagnosis not present

## 2021-10-06 DIAGNOSIS — Z1509 Genetic susceptibility to other malignant neoplasm: Secondary | ICD-10-CM | POA: Diagnosis not present

## 2021-10-06 DIAGNOSIS — D352 Benign neoplasm of pituitary gland: Secondary | ICD-10-CM | POA: Diagnosis not present

## 2021-10-06 DIAGNOSIS — R5383 Other fatigue: Secondary | ICD-10-CM | POA: Diagnosis not present

## 2021-10-06 DIAGNOSIS — Z1501 Genetic susceptibility to malignant neoplasm of breast: Secondary | ICD-10-CM | POA: Diagnosis not present

## 2021-10-06 DIAGNOSIS — R519 Headache, unspecified: Secondary | ICD-10-CM | POA: Diagnosis not present

## 2022-03-21 DIAGNOSIS — E559 Vitamin D deficiency, unspecified: Secondary | ICD-10-CM | POA: Diagnosis not present

## 2022-03-21 DIAGNOSIS — R519 Headache, unspecified: Secondary | ICD-10-CM | POA: Diagnosis not present

## 2022-03-21 DIAGNOSIS — D352 Benign neoplasm of pituitary gland: Secondary | ICD-10-CM | POA: Diagnosis not present

## 2022-03-21 DIAGNOSIS — Z1501 Genetic susceptibility to malignant neoplasm of breast: Secondary | ICD-10-CM | POA: Diagnosis not present

## 2022-03-21 DIAGNOSIS — Z1509 Genetic susceptibility to other malignant neoplasm: Secondary | ICD-10-CM | POA: Diagnosis not present

## 2022-03-21 DIAGNOSIS — R5383 Other fatigue: Secondary | ICD-10-CM | POA: Diagnosis not present

## 2022-04-03 DIAGNOSIS — R519 Headache, unspecified: Secondary | ICD-10-CM | POA: Diagnosis not present

## 2022-04-03 DIAGNOSIS — E559 Vitamin D deficiency, unspecified: Secondary | ICD-10-CM | POA: Diagnosis not present

## 2022-04-03 DIAGNOSIS — E039 Hypothyroidism, unspecified: Secondary | ICD-10-CM | POA: Diagnosis not present

## 2022-04-03 DIAGNOSIS — R5383 Other fatigue: Secondary | ICD-10-CM | POA: Diagnosis not present

## 2022-04-18 DIAGNOSIS — R69 Illness, unspecified: Secondary | ICD-10-CM | POA: Diagnosis not present

## 2022-04-18 DIAGNOSIS — R635 Abnormal weight gain: Secondary | ICD-10-CM | POA: Diagnosis not present

## 2022-04-18 DIAGNOSIS — G4700 Insomnia, unspecified: Secondary | ICD-10-CM | POA: Diagnosis not present

## 2022-04-18 DIAGNOSIS — E538 Deficiency of other specified B group vitamins: Secondary | ICD-10-CM | POA: Diagnosis not present

## 2022-04-18 DIAGNOSIS — R519 Headache, unspecified: Secondary | ICD-10-CM | POA: Diagnosis not present

## 2022-04-18 DIAGNOSIS — E559 Vitamin D deficiency, unspecified: Secondary | ICD-10-CM | POA: Diagnosis not present

## 2022-04-18 DIAGNOSIS — R5383 Other fatigue: Secondary | ICD-10-CM | POA: Diagnosis not present

## 2022-04-18 DIAGNOSIS — D352 Benign neoplasm of pituitary gland: Secondary | ICD-10-CM | POA: Diagnosis not present

## 2022-04-18 DIAGNOSIS — E039 Hypothyroidism, unspecified: Secondary | ICD-10-CM | POA: Diagnosis not present

## 2022-04-18 DIAGNOSIS — Z1501 Genetic susceptibility to malignant neoplasm of breast: Secondary | ICD-10-CM | POA: Diagnosis not present

## 2022-04-26 ENCOUNTER — Ambulatory Visit
Admission: RE | Admit: 2022-04-26 | Discharge: 2022-04-26 | Disposition: A | Discharge status: Home / Self Care | Payer: 59 | Source: Ambulatory Visit | Attending: Family Medicine | Admitting: Family Medicine

## 2022-04-26 ENCOUNTER — Ambulatory Visit: Payer: Self-pay

## 2022-04-26 VITALS — BP 124/85 | HR 85 | Temp 98.6°F | Resp 18 | Ht 62.0 in | Wt 180.0 lb

## 2022-04-26 DIAGNOSIS — J019 Acute sinusitis, unspecified: Secondary | ICD-10-CM | POA: Diagnosis not present

## 2022-04-26 DIAGNOSIS — B9689 Other specified bacterial agents as the cause of diseases classified elsewhere: Secondary | ICD-10-CM

## 2022-04-26 HISTORY — DX: Neoplasm of unspecified behavior of endocrine glands and other parts of nervous system: D49.7

## 2022-04-26 HISTORY — DX: Hypothyroidism, unspecified: E03.9

## 2022-04-26 MED ORDER — AMOXICILLIN-POT CLAVULANATE 875-125 MG PO TABS: 1.0000 | ORAL_TABLET | Freq: Two times a day (BID) | ORAL | 0 refills | Status: AC

## 2022-04-26 NOTE — ED Provider Notes (Signed)
[facility]    CSN: [id number] Arrival date & time: 04/26/22  1600      History   Chief Complaint Chief Complaint  Patient presents with   Nasal Congestion    Entered by patient    HPI Lauren Arroyo is a 31 y.o. female.   HPI  Patient has been sick for over a week with what she thought was a respiratory virus.  In the last 2 days she has had increased nasal drainage and sinus drainage, yellow-green in color, blood streaks in the mucus, and an increase in her cough.  She had fever initially but this is gone.  She does have a headache.  Her son had RSV last week.  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   Gestational hypertension 56/21/3086   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   Genetic testing 05/14/2018   Family history of breast cancer    Family history of pancreatic cancer    Family history of genetic disease carrier    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 Authorizing Provider  amoxicillin-clavulanate (AUGMENTIN) 875-125 MG tablet Take 1 tablet by mouth every 12 (twelve) hours. 04/26/22  Yes Raylene Everts, MD  NP THYROID 30 MG tablet Take 30 mg by mouth every morning. 04/20/22  Yes [provider]    Family History Family History  Problem Relation Age of Onset   Allergic rhinitis Father    Heart block Father    Other Sister        tested positive for a BRCA gene   Other Brother        early gray hair   Breast cancer Maternal Grandmother 44   Breast cancer Other 72   Pancreatic cancer Other    Angioedema Neg Hx    Asthma Neg Hx    Eczema Neg Hx    Immunodeficiency Neg Hx    Urticaria Neg Hx     Social History Social History   Tobacco Use   Smoking status: Never   Smokeless tobacco: Never  Vaping Use   Vaping Use: Never used  Substance Use Topics   Alcohol use: Never   Drug use: Never     Allergies   Bactrim [sulfamethoxazole-trimethoprim], Ciprofloxacin, Nitrofurantoin, Sulfamethoxazole-trimethoprim, Sulfamethoxazole, and Trimethoprim   Review of Systems Review of Systems See HPI  Physical Exam Triage Vital Signs ED Triage Vitals  Enc Vitals Group     BP 04/26/22 1625 124/85     Pulse Rate 04/26/22 1625 85     Resp 04/26/22 1625 18     Temp 04/26/22 1625 98.6 F (37 C)     Temp Source 04/26/22 1625 Oral     SpO2 04/26/22 1625 99 %     Weight 04/26/22 1627 180 lb (81.6 kg)     Height 04/26/22 1627 _0  (1.575 m)     Head Circumference --      Peak Flow --      Pain Score 04/26/22 1626 5     Pain Loc --      Pain Edu? --      Excl. in [place]? --    No data found.  Updated Vital Signs BP 124/85 (BP Location: Left Arm)   Pulse 85   Temp 98.6 F (37 C) (Oral)   Resp 18   Ht _1  (1.575 m)   Wt 81.6 kg   LMP 04/14/2022 (Exact Date)   SpO2 99%   Breastfeeding No   BMI 32.92 kg/m        Physical Exam Constitutional:      General: She is not in acute distress.    Appearance: Normal appearance. She is well-developed.  HENT:     Head: Normocephalic and atraumatic.     Right Ear: Tympanic membrane and ear canal normal.     Left Ear: Tympanic membrane and ear canal normal.     Nose: Congestion and rhinorrhea present.     Mouth/Throat:     Pharynx: Posterior oropharyngeal erythema present.     Comments: Wound is large and red with no exudate.  Maxillary sinus tender Eyes:     Conjunctiva/sclera: Conjunctivae normal.     Pupils: Pupils are equal, round, and reactive to light.  Cardiovascular:     Rate and Rhythm: Normal rate and regular rhythm.     Heart sounds: Normal heart sounds.  Pulmonary:     Effort: Pulmonary effort is normal. No respiratory distress.     Breath sounds: Normal breath sounds.  Abdominal:     General: There is no distension.     Palpations: Abdomen is soft.  Musculoskeletal:        General: Normal range of motion.     Cervical back: Normal range of motion.  Lymphadenopathy:     Cervical: Cervical adenopathy present.  Skin:    General: Skin is warm and dry.  Neurological:     Mental Status: She is alert.      UC Treatments / Results  Labs (all labs ordered are listed, but only abnormal results are displayed) Labs Reviewed - No data to display  EKG   Radiology No results found.  Procedures Procedures (including critical care time)  Medications Ordered in UC Medications - No data to display  Initial Impression / Assessment and Plan / UC Course  I have reviewed the triage vital signs and the nursing notes.  Pertinent labs & imaging results that were available during my care of the patient were reviewed by me and considered in my medical decision making (see chart for details).     Final Clinical Impressions(s) /  UC Diagnoses   Final diagnoses:  Acute bacterial sinusitis     Discharge Instructions      Continue to  drink lots of fluids Salt water/saline rinses will help Take Augmentin 2 times a day for 10 full days See your doctor if not improving by Monday   ED Prescriptions     Medication Sig Dispense Auth. Provider   amoxicillin-clavulanate (AUGMENTIN) 875-125 MG tablet Take 1 tablet by mouth every 12 (twelve) hours. 14 tablet Raylene Everts, MD      PDMP not reviewed this encounter.   Raylene Everts, MD 04/26/22 [phone]

## 2022-04-26 NOTE — Discharge Instructions (Addendum)
Continue to drink lots of fluids Salt water/saline rinses will help Take Augmentin 2 times a day for 10 full days See your doctor if not improving by Monday

## 2022-04-26 NOTE — ED Triage Notes (Signed)
Patient c/o nasal drainage and congestion, productive cough x 1 week.  Patient's son diagnosed with RSV last week.  Low grade fever.

## 2022-11-30 ENCOUNTER — Encounter: Payer: Self-pay | Admitting: Podiatry

## 2022-11-30 ENCOUNTER — Ambulatory Visit: Payer: No Typology Code available for payment source | Admitting: Podiatry

## 2022-11-30 DIAGNOSIS — L600 Ingrowing nail: Secondary | ICD-10-CM | POA: Diagnosis not present

## 2022-11-30 NOTE — Progress Notes (Signed)
  Subjective:  Patient ID: Lauren Arroyo, female    DOB: 04-21-1991,   MRN: [id number]  Chief Complaint  Patient presents with   Ingrown Toenail    Ingrown to right hallux-medial border. No pus or bleeding. Not diabetic.     32 y.o. female presents for concern of an ingrown nail on her right great toenail. She has had and ingrown nail procedure preformed before. Relates the right has now returned and been painful.  . Denies any other pedal complaints. Denies n/v/f/c.   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 CFT <3 seconds. Normal hair growth on digits. No edema.  Skin. No lacerations or abrasions bilateral feet. Right medial border hallux incurvated and tender to the touch.  Musculoskeletal: MMT 5/5 bilateral lower extremities in DF, PF, Inversion and Eversion. Deceased ROM in DF of ankle joint.  Neurological: Sensation intact to light touch.   Assessment:   1. Ingrown right greater toenail      Plan:  Patient was evaluated and treated and all questions answered. Discussed ingrown toenails etiology and treatment options including procedure for removal vs conservative care.  Patient requesting removal of ingrown nail today. Procedure below.  Discussed procedure and post procedure care and patient expressed understanding.  Will follow-up in 2 weeks for nail check or sooner if any problems arise.    Procedure:  Procedure: partial Nail Avulsion of right hallux medial nail border.  Surgeon: Louann Sjogren, DPM  Pre-op Dx: Ingrown toenail without infection Post-op: Same  Place of Surgery: Office exam room.   Indications for surgery: Painful and ingrown toenail.    The patient is requesting removal of nail with chemical matrixectomy. Risks and complications were discussed with the patient for which they understand and written consent was obtained. Under sterile conditions a total of 3 mL of  1% lidocaine plain was infiltrated in a hallux block fashion. Once anesthetized, the skin was prepped in sterile fashion. A tourniquet was then applied. Next the medial aspect of hallux nail border was then sharply excised making sure to remove the entire offending nail border.  Next phenol was then applied under standard conditions and copiously irrigated. Silvadene was applied. A dry sterile dressing was applied. After application of the dressing the tourniquet was removed and there is found to be an immediate capillary refill time to the digit. The patient tolerated the procedure well without any complications. Post procedure instructions were discussed the patient for which he verbally understood. Follow-up in two weeks for nail check or sooner if any problems are to arise. Discussed signs/symptoms of infection and directed to call the office immediately should any occur or go directly to the emergency room. In the meantime, encouraged to call the office with any questions, concerns, changes symptoms.   Louann Sjogren, DPM

## 2022-11-30 NOTE — Patient Instructions (Signed)
